# Patient Record
Sex: Female | Born: 1935 | Race: Black or African American | Hispanic: No | Marital: Single | Smoking: Never smoker
Health system: Southern US, Community
[De-identification: ages and names within clinical notes are randomized; demographics above are authoritative.]

## PROBLEM LIST (undated history)

## (undated) DIAGNOSIS — F0280 Dementia in other diseases classified elsewhere without behavioral disturbance: Secondary | ICD-10-CM

## (undated) DIAGNOSIS — G309 Alzheimer's disease, unspecified: Secondary | ICD-10-CM

## (undated) DIAGNOSIS — E876 Hypokalemia: Secondary | ICD-10-CM

## (undated) DIAGNOSIS — F209 Schizophrenia, unspecified: Secondary | ICD-10-CM

## (undated) DIAGNOSIS — B353 Tinea pedis: Secondary | ICD-10-CM

## (undated) DIAGNOSIS — R600 Localized edema: Secondary | ICD-10-CM

---

## 2013-03-28 LAB — COMPREHENSIVE METABOLIC PANEL
Albumin: 3.5 g/dL (ref 3.4–5.0)
Alkaline Phosphatase: 96 U/L (ref 50–136)
Anion Gap: 13 (ref 7–16)
Calcium, Total: 9 mg/dL (ref 8.5–10.1)
Co2: 25 mmol/L (ref 21–32)
Creatinine: 1.22 mg/dL (ref 0.60–1.30)
EGFR (African American): 50 — ABNORMAL LOW
EGFR (Non-African Amer.): 43 — ABNORMAL LOW
Glucose: 174 mg/dL — ABNORMAL HIGH (ref 65–99)
Osmolality: 287 (ref 275–301)
Sodium: 142 mmol/L (ref 136–145)
Total Protein: 7.6 g/dL (ref 6.4–8.2)

## 2013-03-28 LAB — DRUG SCREEN, URINE
Amphetamines, Ur Screen: NEGATIVE (ref ?–1000)
Benzodiazepine, Ur Scrn: NEGATIVE (ref ?–200)
Cannabinoid 50 Ng, Ur ~~LOC~~: NEGATIVE (ref ?–50)
MDMA (Ecstasy)Ur Screen: NEGATIVE (ref ?–500)
Methadone, Ur Screen: NEGATIVE (ref ?–300)
Opiate, Ur Screen: NEGATIVE (ref ?–300)
Tricyclic, Ur Screen: NEGATIVE (ref ?–1000)

## 2013-03-28 LAB — URINALYSIS, COMPLETE
Bilirubin,UR: NEGATIVE
Blood: NEGATIVE
Glucose,UR: NEGATIVE mg/dL (ref 0–75)
Ketone: NEGATIVE
Protein: NEGATIVE
RBC,UR: 1 /HPF (ref 0–5)
Specific Gravity: 1.012 (ref 1.003–1.030)

## 2013-03-28 LAB — CBC
MCH: 29.3 pg (ref 26.0–34.0)
MCHC: 34 g/dL (ref 32.0–36.0)
RBC: 4.78 10*6/uL (ref 3.80–5.20)
RDW: 15.1 % — ABNORMAL HIGH (ref 11.5–14.5)
WBC: 8.6 10*3/uL (ref 3.6–11.0)

## 2013-03-28 LAB — ETHANOL
Ethanol %: <0.003 % (ref 0.000–0.080)
Ethanol: <3 mg/dL

## 2013-03-28 LAB — TSH: Thyroid Stimulating Horm: 1.45 u[IU]/mL

## 2013-03-29 ENCOUNTER — Inpatient Hospital Stay: Payer: Self-pay | Admitting: Internal Medicine

## 2013-03-29 LAB — BASIC METABOLIC PANEL
Anion Gap: 10 (ref 7–16)
BUN: 8 mg/dL (ref 7–18)
Calcium, Total: 8.7 mg/dL (ref 8.5–10.1)
Co2: 30 mmol/L (ref 21–32)
EGFR (Non-African Amer.): >60
Osmolality: 290 (ref 275–301)
Potassium: 2.8 mmol/L — ABNORMAL LOW (ref 3.5–5.1)

## 2013-03-29 LAB — TROPONIN I: Troponin-I: <0.02 ng/mL

## 2013-03-29 LAB — MAGNESIUM: Magnesium: 1.8 mg/dL

## 2013-03-30 LAB — URINALYSIS, COMPLETE
Bilirubin,UR: NEGATIVE
Glucose,UR: NEGATIVE mg/dL (ref 0–75)
Nitrite: NEGATIVE
Ph: 7 (ref 4.5–8.0)
Protein: NEGATIVE
RBC,UR: 3 /HPF (ref 0–5)
Specific Gravity: 1.013 (ref 1.003–1.030)
WBC UR: 34 /HPF (ref 0–5)

## 2013-03-31 LAB — CBC WITH DIFFERENTIAL/PLATELET
Basophil %: 0.5 %
Eosinophil %: 1.9 %
HCT: 34.7 % — ABNORMAL LOW (ref 35.0–47.0)
Lymphocyte #: 1.9 10*3/uL (ref 1.0–3.6)
Lymphocyte %: 26.8 %
MCH: 29.4 pg (ref 26.0–34.0)
MCHC: 34 g/dL (ref 32.0–36.0)
MCV: 87 fL (ref 80–100)
Monocyte #: 0.5 x10 3/mm (ref 0.2–0.9)
RBC: 4.01 10*6/uL (ref 3.80–5.20)
RDW: 15.4 % — ABNORMAL HIGH (ref 11.5–14.5)
WBC: 7 10*3/uL (ref 3.6–11.0)

## 2013-03-31 LAB — URINE CULTURE

## 2013-03-31 LAB — BASIC METABOLIC PANEL
BUN: 5 mg/dL — ABNORMAL LOW (ref 7–18)
Calcium, Total: 8.4 mg/dL — ABNORMAL LOW (ref 8.5–10.1)
Chloride: 105 mmol/L (ref 98–107)
Co2: 29 mmol/L (ref 21–32)
Glucose: 97 mg/dL (ref 65–99)
Osmolality: 278 (ref 275–301)
Potassium: 2.8 mmol/L — ABNORMAL LOW (ref 3.5–5.1)
Sodium: 141 mmol/L (ref 136–145)

## 2013-04-04 LAB — BASIC METABOLIC PANEL
Anion Gap: 8 (ref 7–16)
Calcium, Total: 9.1 mg/dL (ref 8.5–10.1)
Chloride: 104 mmol/L (ref 98–107)
Co2: 30 mmol/L (ref 21–32)
Creatinine: 0.97 mg/dL (ref 0.60–1.30)
EGFR (African American): >60
Glucose: 112 mg/dL — ABNORMAL HIGH (ref 65–99)
Osmolality: 286 (ref 275–301)
Potassium: 2.8 mmol/L — ABNORMAL LOW (ref 3.5–5.1)

## 2013-04-04 LAB — POTASSIUM: Potassium: 3.3 mmol/L — ABNORMAL LOW (ref 3.5–5.1)

## 2013-04-04 LAB — MAGNESIUM: Magnesium: 2.1 mg/dL

## 2013-06-16 ENCOUNTER — Inpatient Hospital Stay (HOSPITAL_COMMUNITY)
Admission: EM | Admit: 2013-06-16 | Discharge: 2013-06-22 | DRG: 629 | Disposition: A | Discharge status: Skilled Nursing Facility | Payer: Medicare Other | Attending: Internal Medicine | Admitting: Internal Medicine

## 2013-06-16 ENCOUNTER — Encounter (HOSPITAL_COMMUNITY): Payer: Self-pay | Admitting: Emergency Medicine

## 2013-06-16 ENCOUNTER — Emergency Department (HOSPITAL_COMMUNITY): Payer: Medicare Other

## 2013-06-16 DIAGNOSIS — Z79899 Other long term (current) drug therapy: Secondary | ICD-10-CM

## 2013-06-16 DIAGNOSIS — Z6841 Body Mass Index (BMI) 40.0 and over, adult: Secondary | ICD-10-CM

## 2013-06-16 DIAGNOSIS — F209 Schizophrenia, unspecified: Secondary | ICD-10-CM | POA: Diagnosis present

## 2013-06-16 DIAGNOSIS — L97509 Non-pressure chronic ulcer of other part of unspecified foot with unspecified severity: Secondary | ICD-10-CM | POA: Diagnosis present

## 2013-06-16 DIAGNOSIS — M869 Osteomyelitis, unspecified: Secondary | ICD-10-CM | POA: Diagnosis present

## 2013-06-16 DIAGNOSIS — F0390 Unspecified dementia without behavioral disturbance: Secondary | ICD-10-CM

## 2013-06-16 DIAGNOSIS — E1169 Type 2 diabetes mellitus with other specified complication: Principal | ICD-10-CM | POA: Diagnosis present

## 2013-06-16 DIAGNOSIS — F0280 Dementia in other diseases classified elsewhere without behavioral disturbance: Secondary | ICD-10-CM | POA: Diagnosis present

## 2013-06-16 DIAGNOSIS — G309 Alzheimer's disease, unspecified: Secondary | ICD-10-CM | POA: Diagnosis present

## 2013-06-16 DIAGNOSIS — M9080 Osteopathy in diseases classified elsewhere, unspecified site: Secondary | ICD-10-CM | POA: Diagnosis present

## 2013-06-16 DIAGNOSIS — L84 Corns and callosities: Secondary | ICD-10-CM | POA: Diagnosis present

## 2013-06-16 DIAGNOSIS — M2010 Hallux valgus (acquired), unspecified foot: Secondary | ICD-10-CM | POA: Diagnosis present

## 2013-06-16 DIAGNOSIS — M86179 Other acute osteomyelitis, unspecified ankle and foot: Secondary | ICD-10-CM | POA: Diagnosis present

## 2013-06-16 HISTORY — DX: Alzheimer's disease, unspecified: G30.9

## 2013-06-16 HISTORY — DX: Dementia in other diseases classified elsewhere, unspecified severity, without behavioral disturbance, psychotic disturbance, mood disturbance, and anxiety: F02.80

## 2013-06-16 HISTORY — DX: Schizophrenia, unspecified: F20.9

## 2013-06-16 HISTORY — DX: Tinea pedis: B35.3

## 2013-06-16 HISTORY — DX: Localized edema: R60.0

## 2013-06-16 HISTORY — DX: Hypokalemia: E87.6

## 2013-06-16 LAB — CBC WITH DIFFERENTIAL/PLATELET
Basophils Absolute: 0 10*3/uL (ref 0.0–0.1)
Basophils Relative: 0 % (ref 0–1)
Hemoglobin: 12 g/dL (ref 12.0–15.0)
Lymphocytes Relative: 28 % (ref 12–46)
MCHC: 32 g/dL (ref 30.0–36.0)
Monocytes Relative: 7 % (ref 3–12)
Neutro Abs: 4.7 10*3/uL (ref 1.7–7.7)
Neutrophils Relative %: 62 % (ref 43–77)
RBC: 4.13 MIL/uL (ref 3.87–5.11)
WBC: 7.6 10*3/uL (ref 4.0–10.5)

## 2013-06-16 LAB — BASIC METABOLIC PANEL
BUN: 15 mg/dL (ref 6–23)
CO2: 29 mEq/L (ref 19–32)
Chloride: 102 mEq/L (ref 96–112)
Creatinine, Ser: 0.83 mg/dL (ref 0.50–1.10)
GFR calc Af Amer: 77 mL/min — ABNORMAL LOW (ref >90)
Glucose, Bld: 131 mg/dL — ABNORMAL HIGH (ref 70–99)
Potassium: 4.6 mEq/L (ref 3.7–5.3)

## 2013-06-16 MED ORDER — DIVALPROEX SODIUM ER 500 MG PO TB24
500.0000 mg | ORAL_TABLET | Freq: Every day | ORAL | Status: DC
  Administered 2013-06-17 – 2013-06-21 (×5): 500 mg via ORAL
  Filled 2013-06-16 (×7): qty 1

## 2013-06-16 MED ORDER — ACETAMINOPHEN 325 MG PO TABS: 650.0000 mg | ORAL_TABLET | Freq: Four times a day (QID) | ORAL | Status: DC | PRN

## 2013-06-16 MED ORDER — INSULIN ASPART 100 UNIT/ML ~~LOC~~ SOLN
0.0000 [IU] | Freq: Three times a day (TID) | SUBCUTANEOUS | Status: DC
  Administered 2013-06-18: 2 [IU] via SUBCUTANEOUS
  Administered 2013-06-19: 1 [IU] via SUBCUTANEOUS
  Administered 2013-06-20: 2 [IU] via SUBCUTANEOUS
  Administered 2013-06-21: 1 [IU] via SUBCUTANEOUS

## 2013-06-16 MED ORDER — GUAIFENESIN 100 MG/5ML PO SOLN: 10.0000 mL | Freq: Four times a day (QID) | ORAL | Status: DC | PRN

## 2013-06-16 MED ORDER — PSEUDOEPHEDRINE HCL ER 120 MG PO TB12
120.0000 mg | ORAL_TABLET | Freq: Two times a day (BID) | ORAL | Status: DC
  Administered 2013-06-17 – 2013-06-22 (×10): 120 mg via ORAL
  Filled 2013-06-16 (×12): qty 1

## 2013-06-16 MED ORDER — VANCOMYCIN HCL IN DEXTROSE 1-5 GM/200ML-% IV SOLN
1000.0000 mg | Freq: Once | INTRAVENOUS | Status: AC
  Administered 2013-06-16: 1000 mg via INTRAVENOUS
  Filled 2013-06-16: qty 200

## 2013-06-16 MED ORDER — ACETAMINOPHEN 650 MG RE SUPP: 650.0000 mg | Freq: Four times a day (QID) | RECTAL | Status: DC | PRN

## 2013-06-16 MED ORDER — LORATADINE-PSEUDOEPHEDRINE ER 10-240 MG PO TB24: 1.0000 | ORAL_TABLET | Freq: Every day | ORAL | Status: DC

## 2013-06-16 MED ORDER — HYDROCODONE-ACETAMINOPHEN 5-325 MG PO TABS: 1.0000 | ORAL_TABLET | ORAL | Status: DC | PRN

## 2013-06-16 MED ORDER — SODIUM CHLORIDE 0.9 % IJ SOLN
3.0000 mL | Freq: Two times a day (BID) | INTRAMUSCULAR | Status: DC
  Administered 2013-06-18: 3 mL via INTRAVENOUS

## 2013-06-16 MED ORDER — ONDANSETRON HCL 4 MG PO TABS: 4.0000 mg | ORAL_TABLET | Freq: Four times a day (QID) | ORAL | Status: DC | PRN

## 2013-06-16 MED ORDER — POTASSIUM CHLORIDE ER 10 MEQ PO TBCR
10.0000 meq | EXTENDED_RELEASE_TABLET | Freq: Every day | ORAL | Status: DC
  Administered 2013-06-17 – 2013-06-22 (×6): 10 meq via ORAL
  Filled 2013-06-16 (×6): qty 1

## 2013-06-16 MED ORDER — LORATADINE 10 MG PO TABS
10.0000 mg | ORAL_TABLET | Freq: Every day | ORAL | Status: DC
  Administered 2013-06-17 – 2013-06-22 (×6): 10 mg via ORAL
  Filled 2013-06-16 (×7): qty 1

## 2013-06-16 MED ORDER — VENLAFAXINE HCL 37.5 MG PO TABS
37.5000 mg | ORAL_TABLET | Freq: Two times a day (BID) | ORAL | Status: DC
  Administered 2013-06-17 – 2013-06-22 (×11): 37.5 mg via ORAL
  Filled 2013-06-16 (×13): qty 1

## 2013-06-16 MED ORDER — ZOLPIDEM TARTRATE 5 MG PO TABS
5.0000 mg | ORAL_TABLET | Freq: Every evening | ORAL | Status: DC | PRN
  Administered 2013-06-20: 5 mg via ORAL
  Filled 2013-06-16: qty 1

## 2013-06-16 MED ORDER — PIPERACILLIN-TAZOBACTAM 3.375 G IVPB
3.3750 g | Freq: Three times a day (TID) | INTRAVENOUS | Status: DC
  Administered 2013-06-17 – 2013-06-18 (×4): 3.375 g via INTRAVENOUS
  Filled 2013-06-16 (×5): qty 50

## 2013-06-16 MED ORDER — SODIUM CHLORIDE 0.9 % IJ SOLN: 3.0000 mL | INTRAMUSCULAR | Status: DC | PRN

## 2013-06-16 MED ORDER — ONDANSETRON HCL 4 MG/2ML IJ SOLN: 4.0000 mg | Freq: Four times a day (QID) | INTRAMUSCULAR | Status: DC | PRN

## 2013-06-16 MED ORDER — ENOXAPARIN SODIUM 40 MG/0.4ML ~~LOC~~ SOLN
40.0000 mg | Freq: Every day | SUBCUTANEOUS | Status: DC
  Administered 2013-06-17 – 2013-06-21 (×6): 40 mg via SUBCUTANEOUS
  Filled 2013-06-16 (×7): qty 0.4

## 2013-06-16 MED ORDER — VANCOMYCIN HCL IN DEXTROSE 750-5 MG/150ML-% IV SOLN
750.0000 mg | Freq: Two times a day (BID) | INTRAVENOUS | Status: AC
Start: 2013-06-16 — End: 2013-06-18
  Administered 2013-06-17 – 2013-06-18 (×4): 750 mg via INTRAVENOUS
  Filled 2013-06-16 (×5): qty 150

## 2013-06-16 MED ORDER — SODIUM CHLORIDE 0.9 % IV SOLN: 250.0000 mL | INTRAVENOUS | Status: DC | PRN

## 2013-06-16 MED ORDER — PIPERACILLIN-TAZOBACTAM 3.375 G IVPB 30 MIN
3.3750 g | Freq: Once | INTRAVENOUS | Status: AC
  Administered 2013-06-16: 3.375 g via INTRAVENOUS
  Filled 2013-06-16: qty 50

## 2013-06-16 MED ORDER — ALUM & MAG HYDROXIDE-SIMETH 200-200-20 MG/5ML PO SUSP: 30.0000 mL | Freq: Four times a day (QID) | ORAL | Status: DC | PRN

## 2013-06-16 MED ORDER — SODIUM CHLORIDE 0.9 % IV SOLN
Freq: Once | INTRAVENOUS | Status: AC
  Administered 2013-06-16: 18:00:00 via INTRAVENOUS

## 2013-06-16 MED ORDER — MORPHINE SULFATE 2 MG/ML IJ SOLN: 1.0000 mg | INTRAMUSCULAR | Status: DC | PRN

## 2013-06-16 MED ORDER — ZIPRASIDONE HCL 80 MG PO CAPS
80.0000 mg | ORAL_CAPSULE | Freq: Two times a day (BID) | ORAL | Status: DC
  Administered 2013-06-17 – 2013-06-22 (×11): 80 mg via ORAL
  Filled 2013-06-16 (×13): qty 1

## 2013-06-16 NOTE — ED Notes (Signed)
Pt from [facility], memory care.  Pt is pleasantly confused c/o rt foot drainage.  Odorous discharge.  PA came in today and wanted her evaluated.

## 2013-06-16 NOTE — Progress Notes (Signed)
Utilization Review completed.  Willim Turnage RN CM  

## 2013-06-16 NOTE — ED Provider Notes (Signed)
CSN: [id number]     Arrival date & time 06/16/13  1536 History   First MD Initiated Contact with Patient 06/16/13 1547     Chief Complaint  Patient presents with  . Wound Infection   (Consider location/radiation/quality/duration/timing/severity/associated sxs/prior Treatment) HPI Comments: Patient presents with right foot pain and drainage. She states she noticed a sore about 2 weeks ago and that's progressively gotten worse. She currently lives in [facility] in the memory care unit.  She denies any fevers or chills. She denies any history of diabetes.  She has not been taking any medications for this infection.  The PA at the facility saw it today and sent her over for here for evaluation.   Past Medical History  Diagnosis Date  . Alzheimer's dementia   . Schizophrenia   . Tinea pedis   . Bilateral lower extremity edema   . Hypokalemia    History reviewed. No pertinent past surgical history. History reviewed. No pertinent family history. History  Substance Use Topics  . Smoking status: Never Smoker   . Smokeless tobacco: Not on file  . Alcohol Use: No   OB History   Grav Para Term Preterm Abortions TAB SAB Ect Mult Living                 Review of Systems  Constitutional: Negative for fever, chills, diaphoresis and fatigue.  HENT: Negative for congestion, rhinorrhea and sneezing.   Eyes: Negative.   Respiratory: Negative for cough, chest tightness and shortness of breath.   Cardiovascular: Negative for chest pain and leg swelling.  Gastrointestinal: Negative for nausea, vomiting, abdominal pain, diarrhea and blood in stool.  Genitourinary: Negative for frequency, hematuria, flank pain and difficulty urinating.  Musculoskeletal: Positive for arthralgias. Negative for back pain.  Skin: Positive for wound. Negative for rash.  Neurological: Negative for dizziness, speech difficulty, weakness, numbness and headaches.    Allergies  Review of patient's allergies  indicates no known allergies.  Home Medications   Current Outpatient Rx  Name  Route  Sig  Dispense  Refill  . acetaminophen (TYLENOL) 500 MG tablet   Oral   Take 500 mg by mouth every 4 (four) hours as needed (fever/pain).         Marland Kitchen alum & mag hydroxide-simeth (MAALOX/MYLANTA) 200-200-20 MG/5ML suspension   Oral   Take 30 mLs by mouth every 6 (six) hours as needed for indigestion or heartburn.         . divalproex (DEPAKOTE ER) 500 MG 24 hr tablet   Oral   Take 500 mg by mouth at bedtime.         Marland Kitchen guaiFENesin (ROBITUSSIN) 100 MG/5ML SOLN   Oral   Take 10 mLs by mouth every 6 (six) hours as needed for cough or to loosen phlegm (not to exceed 4 doses).         Marland Kitchen loperamide (IMODIUM A-D) 2 MG tablet   Oral   Take 2 mg by mouth 4 (four) times daily as needed for diarrhea or loose stools.         Marland Kitchen loratadine-pseudoephedrine (CLARITIN-D 24-HOUR) 10-240 MG per 24 hr tablet   Oral   Take 1 tablet by mouth daily.         . magnesium hydroxide (MILK OF MAGNESIA) 400 MG/5ML suspension   Oral   Take 30 mLs by mouth at bedtime as needed for mild constipation.         . potassium chloride (KLOR-CON 10) 10  MEQ tablet   Oral   Take 10 mEq by mouth daily.         Marland Kitchen venlafaxine (EFFEXOR) 37.5 MG tablet   Oral   Take 37.5 mg by mouth 2 (two) times daily.         . ziprasidone (GEODON) 80 MG capsule   Oral   Take 80 mg by mouth 2 (two) times daily with a meal.          BP 116/69  Pulse 78  Temp(Src) 97.7 F (36.5 C) (Oral)  Resp 16  SpO2 98% Physical Exam  Constitutional: She is oriented to person, place, and time. She appears well-developed and well-nourished.  HENT:  Head: Normocephalic and atraumatic.  Eyes: Pupils are equal, round, and reactive to light.  Neck: Normal range of motion. Neck supple.  Cardiovascular: Normal rate, regular rhythm and normal heart sounds.   Pulmonary/Chest: Effort normal and breath sounds normal. No respiratory distress.  She has no wheezes. She has no rales. She exhibits no tenderness.  Abdominal: Soft. Bowel sounds are normal. There is no tenderness. There is no rebound and no guarding.  Musculoskeletal: Normal range of motion. She exhibits edema.  Patient has a 1.5 cm open wound to the plantar surface of the right first toe at the MTP joint. There is minimal drainage from the area. The skin is scaly around it and there some blackened tissue to the dorsum of the foot around the toe and in the web space between the first and second digits. There some warmth and erythema to the foot and some warmth to the lower leg. Pedal pulses are intact.  Lymphadenopathy:    She has no cervical adenopathy.  Neurological: She is alert and oriented to person, place, and time.  Skin: Skin is warm and dry. No rash noted.  Psychiatric: She has a normal mood and affect.    ED Course  Procedures (including critical care time) Labs Review Labs Reviewed  BASIC METABOLIC PANEL - Abnormal; Notable for the following:    Glucose, Bld 131 (*)    GFR calc non Af Amer 67 (*)    GFR calc Af Amer 77 (*)    All other components within normal limits  CBC WITH DIFFERENTIAL   Imaging Review Dg Foot Complete Right  06/16/2013   CLINICAL DATA:  Nonhealing wound.  EXAM: RIGHT FOOT COMPLETE - 3+ VIEW  COMPARISON:  None.  FINDINGS: Severe hallux valgus deformity with associated degenerative changes at the 1st metatarsal phalangeal joint. There is a wound containing air on the plantar aspect of the foot overlying the 1st metatarsal phalangeal joint. They lucency in the 1st metatarsal head could suggest subtle changes of osteomyelitis. No gross destructive bony changes. The remaining bony structures are intact. A moderate size calcaneal heel spur is noted. Moderate pes planus.  IMPRESSION: Open wound containing air on the plantar aspect of the forefoot overlying the 1st metatarsal phalangeal joint.  Mild lucency in the 1st metatarsal head. Could not  exclude early changes of osteomyelitis.   Electronically Signed   By: Loralie Champagne M.D.   On: 06/16/2013 16:46    EKG Interpretation   None       MDM   1. Osteomyelitis    Patient presents with an open wound to her right foot. There some findings concerning for a dry gangrene. There's also some probable osteomyelitis on x-ray. She was started on IV antibiotics including vancomycin and Zosyn. I will consult hospitalist for admission.  Rolan Bucco, MD 06/16/13 [phone]

## 2013-06-16 NOTE — Progress Notes (Signed)
   CARE MANAGEMENT ED NOTE 06/16/2013  Patient:  [name], [name]   Account Number:  [id number]  Date Initiated:  06/16/2013  Documentation initiated by:  Radford Pax  Subjective/Objective Assessment:   Patient presents to ED with right foot pain and drainage.     Subjective/Objective Assessment Detail:   Xray performed on right foot concerning for osteomylitis.     Action/Plan:   IV fluids and IV antibiotics given to patient in ED.   Action/Plan Detail:   Anticipated DC Date:       Status Recommendation to Physician:   Result of Recommendation:    Other ED Services  Consult Working Plan    DC Planning Services  Other    Choice offered to / List presented to:            Status of service:    ED Comments:   ED Comments Detail:  As per patient's chart, patient is resident at [facility].  Her pcp at this facility is Dr. Ron Parker. System updated.

## 2013-06-16 NOTE — Progress Notes (Signed)
ANTIBIOTIC CONSULT NOTE - INITIAL  Pharmacy Consult for Vancomycin and Zosyn Indication: Osetomyelitis right foot  No Known Allergies  Patient Measurements: Height: 4\' 9"  (144.8 cm) Weight: 195 lb 12.3 oz (88.8 kg) IBW/kg (Calculated) : 38.6   Vital Signs: Temp: 98.6 F (37 C) (12/31 2231) Temp src: Oral (12/31 2231) BP: 121/65 mmHg (12/31 2231) Pulse Rate: 87 (12/31 2231)    Intake/Output from this shift: Total I/O In: -  Out: 600 [Urine:600]  Labs:  Recent Labs  06/16/13 1700  WBC 7.6  HGB 12.0  PLT 278  CREATININE 0.83   Estimated Creatinine Clearance: 53.4 ml/min (by C-G formula based on Cr of 0.83). No results found for this basename: VANCOTROUGH, VANCOPEAK, VANCORANDOM, GENTTROUGH, GENTPEAK, GENTRANDOM, TOBRATROUGH, TOBRAPEAK, TOBRARND, AMIKACINPEAK, AMIKACINTROU, AMIKACIN,  in the last 72 hours   Microbiology: No results found for this or any previous visit (from the past 720 hour(s)).  Medical History: Past Medical History  Diagnosis Date  . Alzheimer's dementia   . Schizophrenia   . Tinea pedis   . Bilateral lower extremity edema   . Hypokalemia     Medications:  Scheduled:  . divalproex  500 mg Oral QHS  . enoxaparin (LOVENOX) injection  40 mg Subcutaneous QHS  . [START ON 06/17/2013] insulin aspart  0-9 Units Subcutaneous TID WC  . [START ON 06/17/2013] loratadine  10 mg Oral Daily   And  . [START ON 06/17/2013] pseudoephedrine  120 mg Oral BID  . [START ON 06/17/2013] piperacillin-tazobactam (ZOSYN)  IV  3.375 g Intravenous Q8H  . [START ON 06/17/2013] potassium chloride  10 mEq Oral Daily  . sodium chloride  3 mL Intravenous Q12H  . vancomycin  750 mg Intravenous Q12H  . venlafaxine  37.5 mg Oral BID  . [START ON 06/17/2013] ziprasidone  80 mg Oral BID WC   Infusions:    Assessment: [name] presents from [facility] memory care unit with open wound on right foot concerning for dry gangrene and probable osteomyelitis on x-ray. She received one-time doses of  vancomycin 1g and Zosyn 3.375g in the ED.  Afebrile WBC 7.6 SCr 0.83  Goal of Therapy:  Vancomycin trough level 15-20 mcg/ml  Plan:  1. Vancomycin 1Gm x1 then 750mg  IV q12h 2. Zosyn 3.375g IV q8h (4 hour infusion time). 3. F/u SCr, trough levels for dose adjustments.  Susanne Greenhouse R 06/16/2013,11:37 PM

## 2013-06-16 NOTE — H&P (Signed)
[facility]                                                                                    Patient Demographics  [name], is a 78 y.o. female  CSN: [id number]  MRN: [id number]  DOB - June 16, 1935  Admit Date - 06/16/2013  Outpatient Primary MD for the patient is Ron Parker, MD   With History of -  Past Medical History  Diagnosis Date  . Alzheimer's dementia   . Schizophrenia   . Tinea pedis   . Bilateral lower extremity edema   . Hypokalemia       History reviewed. No pertinent past surgical history.  in for   Chief Complaint  Patient presents with  . Wound Infection     HPI  [name]  is a 78 y.o. female, sent from the nursing home for evaluation of a right foot wound with foul-smelling discharge. I do not have any information about that time this has started . The patient is not a very good historian and is pleasantly confused. Patient denies any chest pain shortness of breath denies any fever or chills. No history of trauma to right foot, patient denies any pain.    Review of Systems    In addition to the HPI above,  No Fever-chills, No Headache, No changes with Vision or hearing, No problems swallowing food or Liquids, No Chest pain, Cough or Shortness of Breath, No Abdominal pain, No Nausea or Vommitting, Bowel movements are regular, No Blood in stool or Urine, No dysuria, No new skin rashes or bruises, No new joints pains-aches,  No new weakness, tingling, numbness in any extremity, No recent weight gain or loss, No polyuria, polydypsia or polyphagia, No significant Mental Stressors.  A full 10 point Review of Systems was done, except as stated above, all other Review of Systems were negative.   Social History History  Substance Use Topics  . Smoking status: Never Smoker   . Smokeless tobacco: Not on file  . Alcohol Use: No     Family History Could not be obtained due to patient's condition.  Prior to  Admission medications   Medication Sig Start Date End Date Taking? Authorizing Provider  acetaminophen (TYLENOL) 500 MG tablet Take 500 mg by mouth every 4 (four) hours as needed (fever/pain).   Yes Historical Provider, MD  alum & mag hydroxide-simeth (MAALOX/MYLANTA) 200-200-20 MG/5ML suspension Take 30 mLs by mouth every 6 (six) hours as needed for indigestion or heartburn.   Yes Historical Provider, MD  divalproex (DEPAKOTE ER) 500 MG 24 hr tablet Take 500 mg by mouth at bedtime.   Yes Historical Provider, MD  guaiFENesin (ROBITUSSIN) 100 MG/5ML SOLN Take 10 mLs by mouth every 6 (six) hours as needed for cough or to loosen phlegm (not to exceed 4 doses).   Yes Historical Provider, MD  loperamide (IMODIUM A-D) 2 MG tablet Take 2 mg by mouth 4 (four) times daily as needed for diarrhea or loose stools.   Yes Historical Provider, MD  loratadine-pseudoephedrine (CLARITIN-D 24-HOUR) 10-240 MG per 24 hr tablet Take 1 tablet by mouth daily.   Yes Historical Provider, MD  magnesium  hydroxide (MILK OF MAGNESIA) 400 MG/5ML suspension Take 30 mLs by mouth at bedtime as needed for mild constipation.   Yes Historical Provider, MD  potassium chloride (KLOR-CON 10) 10 MEQ tablet Take 10 mEq by mouth daily.   Yes Historical Provider, MD  venlafaxine (EFFEXOR) 37.5 MG tablet Take 37.5 mg by mouth 2 (two) times daily.   Yes Historical Provider, MD  ziprasidone (GEODON) 80 MG capsule Take 80 mg by mouth 2 (two) times daily with a meal.   Yes Historical Provider, MD    Allergies no known allergies  Physical Exam  Vitals  Blood pressure 128/67, pulse 85, temperature 98.5 F (36.9 C), temperature source Oral, resp. rate 16, SpO2 100.00%.   1. General elderly African American female in no acute distress  2.  Not Suicidal or Homicidal, confused.  3. No F.N deficits, ALL C.Nerves Intact, Strength 5/5 all 4 extremities, Sensation intact all 4 extremities, Plantars down going.  4. Ears and Eyes appear Normal,  Conjunctivae clear, PERRLA. Moist Oral Mucosa.  5. Supple Neck, No JVD, No cervical lymphadenopathy appriciated, No Carotid Bruits.  6. Symmetrical Chest wall movement, Good air movement bilaterally, CTAB.  7. RRR, No Gallops, Rubs or Murmurs, No Parasternal Heave.  8. Positive Bowel Sounds, Abdomen Soft, Non tender, No organomegaly appriciated,No rebound -guarding or rigidity.  9.  No Cyanosis, Normal Skin Turgor, No Skin Rash or Bruise.  10. Good muscle tone,  joints appear normal , no effusions, Normal ROM.  11. No Palpable Lymph Nodes in Neck or Axillae  12. Right foot ulcer, first tarsal metatarsal bone open and draining, brownish discharge around one by one centimeters round  Data Review  CBC  Recent Labs Lab 06/16/13 1700  WBC 7.6  HGB 12.0  HCT 37.5  PLT 278  MCV 90.8  MCH 29.1  MCHC 32.0  RDW 14.3  LYMPHSABS 2.2  MONOABS 0.5  EOSABS 0.2  BASOSABS 0.0   ------------------------------------------------------------------------------------------------------------------  Chemistries   Recent Labs Lab 06/16/13 1700  NA 140  K 4.6  CL 102  CO2 29  GLUCOSE 131*  BUN 15  CREATININE 0.83  CALCIUM 9.3   ------------------------------------------------------------------------------------------------------------------  Dg Foot Complete Right  06/16/2013   CLINICAL DATA:  Nonhealing wound.  EXAM: RIGHT FOOT COMPLETE - 3+ VIEW  COMPARISON:  None.  FINDINGS: Severe hallux valgus deformity with associated degenerative changes at the 1st metatarsal phalangeal joint. There is a wound containing air on the plantar aspect of the foot overlying the 1st metatarsal phalangeal joint. They lucency in the 1st metatarsal head could suggest subtle changes of osteomyelitis. No gross destructive bony changes. The remaining bony structures are intact. A moderate size calcaneal heel spur is noted. Moderate pes planus.  IMPRESSION: Open wound containing air on the plantar aspect of  the forefoot overlying the 1st metatarsal phalangeal joint.  Mild lucency in the 1st metatarsal head. Could not exclude early changes of osteomyelitis.   Electronically Signed   By: Loralie Champagne M.D.   On: 06/16/2013 16:46      Assessment & Plan  1.  foot osteomyelitiswith open ulcer on the tarsal metatarsal joint. Patient is nonseptic however received IV antibiotics o  in the emergency room. Pulses were felt but feeble. Right foot is warm. Check right leg Doppler 2. Mild hyperglycemia, no history of diabetes mellitus, start insulin sliding scale 3. Dementia; continue with medications 4. Schizophrenia; continue with medications  Plan  Continue with IV antibiotics started in the emergency room Culture right foot  ulcer Consult orthopedics in a.m. Elevate right foot    DVT Prophylaxis Lovenox  AM Labs Ordered, also please review Full Orders    Code Status Full  Disposition Plan:Back to Assisted living facility  Time spent in minutes : 36 minutes  Condition fair

## 2013-06-17 DIAGNOSIS — L97509 Non-pressure chronic ulcer of other part of unspecified foot with unspecified severity: Secondary | ICD-10-CM

## 2013-06-17 DIAGNOSIS — M86179 Other acute osteomyelitis, unspecified ankle and foot: Secondary | ICD-10-CM

## 2013-06-17 LAB — GLUCOSE, CAPILLARY
GLUCOSE-CAPILLARY: 92 mg/dL (ref 70–99)
Glucose-Capillary: 90 mg/dL (ref 70–99)
Glucose-Capillary: 100 mg/dL — ABNORMAL HIGH (ref 70–99)
Glucose-Capillary: 120 mg/dL — ABNORMAL HIGH (ref 70–99)

## 2013-06-17 LAB — HEMOGLOBIN A1C
Hgb A1c MFr Bld: 6.3 % — ABNORMAL HIGH (ref <5.7)
Mean Plasma Glucose: 134 mg/dL — ABNORMAL HIGH (ref <117)

## 2013-06-17 NOTE — Progress Notes (Signed)
VASCULAR LAB PRELIMINARY  ARTERIAL  ABI completed:Right ABI indicates normal arterial flow, however, waveforms are abnormal.  Left ABI indicates moderate reduction in arterial flow, however, waveforms are abnormal.  Pressures may be falsely elevated secondary to calcified vessels.    RIGHT    LEFT    PRESSURE WAVEFORM  PRESSURE WAVEFORM  BRACHIAL 130 T BRACHIAL    DP   DP    AT 122 DM AT 94 DM  PT 111 DM PT 45 DM  PER   PER    GREAT TOE  NA GREAT TOE  NA    RIGHT LEFT  ABI 0.94 0.72     Jamyson Jirak, RVT 06/17/2013, 11:53 AM

## 2013-06-17 NOTE — Progress Notes (Signed)
[facility] PROGRESS NOTE  Tharon Kitch ZOX:[id number] DOB: August 18, 1934 DOA: Jul 03, 2013 PCP: Ron Parker, MD  Brief narrative: 79 y.o. Female who presented to [facility] ED 07/03/2013 from SNF with right foot ulcer with drainage and concern for possible infection. She was subsequently found to have possible osteomyelitis based on x ray findings.  Assessment/Plan:  Principal Problem:   Osteomyelitis, right foot - continue vanco and zosyn - appreciate orthopedic surgery consult and recommendations.   Active Problems:   Dementia - stable    Schizophrenia - on divalproex   Code Status: full code Family Communication: no family at the bedside  Disposition Plan: to SNF once stable  Manson Passey, MD  [facility] Pager [phone]  If 7PM-7AM, please contact night-coverage [email or web address] Password [email or web address] 06/17/2013, 10:51 AM   LOS: 1 day   Consultants:  Orthopedic surgery  Procedures:  I&D 06/17/2013  Antibiotics:  vanco July 03, 2013 -->  Zosyn 07-03-2013 -->  HPI/Subjective: No overnight events.   Objective: Filed Vitals:   03-Jul-2013 1545 Jul 03, 2013 1848 03-Jul-2013 2231 06/17/13 0647  BP: 116/69 128/67 121/65 138/71  Pulse: 78 85 87 81  Temp: 97.7 F (36.5 C) 98.5 F (36.9 C) 98.6 F (37 C) 98.3 F (36.8 C)  TempSrc: Oral Oral Oral Oral  Resp: 16 16 16 16   Height:   4\' 9"  (1.448 m)   Weight:   88.8 kg (195 lb 12.3 oz)   SpO2: 98% 100% 100% 99%    Intake/Output Summary (Last 24 hours) at 06/17/13 1051 Last data filed at 06/17/13 0644  Gross per 24 hour  Intake    250 ml  Output   1500 ml  Net  -1250 ml    Exam:   General:  Pt is not in acute distress  Cardiovascular: Regular rate and rhythm, S1/S2 appreciated   Respiratory: Clear to auscultation bilaterally, no wheezing, no crackles, no rhonchi  Abdomen: Soft, non tender, non distended, bowel sounds present, no guarding  Extremities: right foot ulcer, first metatarsal  Neuro: Grossly  nonfocal  Data Reviewed: Basic Metabolic Panel:  Recent Labs Lab 07-03-2013 1700  NA 140  K 4.6  CL 102  CO2 29  GLUCOSE 131*  BUN 15  CREATININE 0.83  CALCIUM 9.3   Liver Function Tests: No results found for this basename: AST, ALT, ALKPHOS, BILITOT, PROT, ALBUMIN,  in the last 168 hours No results found for this basename: LIPASE, AMYLASE,  in the last 168 hours No results found for this basename: AMMONIA,  in the last 168 hours CBC:  Recent Labs Lab 07-03-13 1700  WBC 7.6  NEUTROABS 4.7  HGB 12.0  HCT 37.5  MCV 90.8  PLT 278   Cardiac Enzymes: No results found for this basename: CKTOTAL, CKMB, CKMBINDEX, TROPONINI,  in the last 168 hours BNP: No components found with this basename: POCBNP,  CBG:  Recent Labs Lab 06/17/13 0731  GLUCAP 90    No results found for this or any previous visit (from the past 240 hour(s)).   Studies: Dg Foot Complete Right  July 03, 2013   CLINICAL DATA:  Nonhealing wound.  EXAM: RIGHT FOOT COMPLETE - 3+ VIEW  COMPARISON:  None.  FINDINGS: Severe hallux valgus deformity with associated degenerative changes at the 1st metatarsal phalangeal joint. There is a wound containing air on the plantar aspect of the foot overlying the 1st metatarsal phalangeal joint. They lucency in the 1st metatarsal head could suggest subtle changes of osteomyelitis. No gross destructive bony changes. The remaining bony structures are intact. A  moderate size calcaneal heel spur is noted. Moderate pes planus.  IMPRESSION: Open wound containing air on the plantar aspect of the forefoot overlying the 1st metatarsal phalangeal joint.  Mild lucency in the 1st metatarsal head. Could not exclude early changes of osteomyelitis.   Electronically Signed   By: Loralie ChampagneMark  Gallerani M.D.   On: 06/16/2013 16:46    Scheduled Meds: . divalproex  500 mg Oral QHS  . enoxaparin (LOVENOX) injection  40 mg Subcutaneous QHS  . insulin aspart  0-9 Units Subcutaneous TID WC  . loratadine  10  mg Oral Daily   And  . pseudoephedrine  120 mg Oral BID  . piperacillin-tazobactam (ZOSYN)  IV  3.375 g Intravenous Q8H  . potassium chloride  10 mEq Oral Daily  . vancomycin  750 mg Intravenous Q12H  . venlafaxine  37.5 mg Oral BID  . ziprasidone  80 mg Oral BID WC

## 2013-06-17 NOTE — Consult Note (Signed)
Reason for Consult:  Right foot ulcer with oseto Referring Physician:   [facility]  [name] is an 79 y.o. female.  HPI:   79 yo female admitted last evening from a nursing facility.  Is noted to have an ucler near hear right foot 1st ray area.  X-rays with concern for underlying osteo.  Ortho is consulted.  Past Medical History  Diagnosis Date  . Alzheimer's dementia   . Schizophrenia   . Tinea pedis   . Bilateral lower extremity edema   . Hypokalemia     History reviewed. No pertinent past surgical history.  History reviewed. No pertinent family history.  Social History:  reports that she has never smoked. She does not have any smokeless tobacco history on file. She reports that she does not drink alcohol or use illicit drugs.  Allergies: No Known Allergies  Medications: I have reviewed the patient's current medications.  Results for orders placed during the hospital encounter of 06/16/13 (from the past 48 hour(s))  CBC WITH DIFFERENTIAL     Status: None   Collection Time    06/16/13  5:00 PM      Result Value Range   WBC 7.6  4.0 - 10.5 K/uL   RBC 4.13  3.87 - 5.11 MIL/uL   Hemoglobin 12.0  12.0 - 15.0 g/dL   HCT 37.5  36.0 - 46.0 %   MCV 90.8  78.0 - 100.0 fL   MCH 29.1  26.0 - 34.0 pg   MCHC 32.0  30.0 - 36.0 g/dL   RDW 14.3  11.5 - 15.5 %   Platelets 278  150 - 400 K/uL   Neutrophils Relative % 62  43 - 77 %   Neutro Abs 4.7  1.7 - 7.7 K/uL   Lymphocytes Relative 28  12 - 46 %   Lymphs Abs 2.2  0.7 - 4.0 K/uL   Monocytes Relative 7  3 - 12 %   Monocytes Absolute 0.5  0.1 - 1.0 K/uL   Eosinophils Relative 2  0 - 5 %   Eosinophils Absolute 0.2  0.0 - 0.7 K/uL   Basophils Relative 0  0 - 1 %   Basophils Absolute 0.0  0.0 - 0.1 K/uL  BASIC METABOLIC PANEL     Status: Abnormal   Collection Time    06/16/13  5:00 PM      Result Value Range   Sodium 140  137 - 147 mEq/L   Comment: Please note change in reference range.   Potassium 4.6  3.7 - 5.3  mEq/L   Comment: Please note change in reference range.   Chloride 102  96 - 112 mEq/L   CO2 29  19 - 32 mEq/L   Glucose, Bld 131 (*) 70 - 99 mg/dL   BUN 15  6 - 23 mg/dL   Creatinine, Ser 0.83  0.50 - 1.10 mg/dL   Calcium 9.3  8.4 - 10.5 mg/dL   GFR calc non Af Amer 67 (*) >90 mL/min   GFR calc Af Amer 77 (*) >90 mL/min   Comment: (NOTE)     The eGFR has been calculated using the CKD EPI equation.     This calculation has not been validated in all clinical situations.     eGFR's persistently <90 mL/min signify possible Chronic Kidney     Disease.  HEMOGLOBIN A1C     Status: Abnormal   Collection Time    06/16/13  5:00 PM  Result Value Range   Hemoglobin A1C 6.3 (*) <5.7 %   Comment: (NOTE)                                                                               According to the ADA Clinical Practice Recommendations for 2011, when     HbA1c is used as a screening test:      >=6.5%   Diagnostic of Diabetes Mellitus               (if abnormal result is confirmed)     5.7-6.4%   Increased risk of developing Diabetes Mellitus     References:Diagnosis and Classification of Diabetes Mellitus,Diabetes     WCBJ,6283,15(VVOHY 1):S62-S69 and Standards of Medical Care in             Diabetes - 2011,Diabetes WVPX,1062,69 (Suppl 1):S11-S61.   Mean Plasma Glucose 134 (*) <117 mg/dL   Comment: Performed at [facility], CAP[facility]     Status: None   Collection Time    06/17/13  7:31 AM      Result Value Range   Glucose-Capillary 90  70 - 99 mg/dL   Comment 1 Notify RN    GLUCOSE, CAPILLARY     Status: None   Collection Time    06/17/13 12:15 PM      Result Value Range   Glucose-Capillary 92  70 - 99 mg/dL   Comment 1 Documented in Chart     Comment 2 Notify RN      Dg Foot Complete Right  06/16/2013   CLINICAL DATA:  Nonhealing wound.  EXAM: RIGHT FOOT COMPLETE - 3+ VIEW  COMPARISON:  None.  FINDINGS: Severe hallux valgus deformity with associated  degenerative changes at the 1st metatarsal phalangeal joint. There is a wound containing air on the plantar aspect of the foot overlying the 1st metatarsal phalangeal joint. They lucency in the 1st metatarsal head could suggest subtle changes of osteomyelitis. No gross destructive bony changes. The remaining bony structures are intact. A moderate size calcaneal heel spur is noted. Moderate pes planus.  IMPRESSION: Open wound containing air on the plantar aspect of the forefoot overlying the 1st metatarsal phalangeal joint.  Mild lucency in the 1st metatarsal head. Could not exclude early changes of osteomyelitis.   Electronically Signed   By: Kalman Jewels M.D.   On: 06/16/2013 16:46    ROS Blood pressure 138/71, pulse 81, temperature 98.3 F (36.8 C), temperature source Oral, resp. rate 16, height $RemoveBe'4\' 9"'RbzSahqMd$  (1.448 m), weight 88.8 kg (195 lb 12.3 oz), SpO2 99.00%. 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 1)  I did use a #10 blade at the bedside and performed a debridement of callous skin, soft-tissue and local bone.  Found some serous fluid and was able to get to a good bleeding base.  She will need daily soaks in dial-soapy water at the bedside followed by wet-to-dry dressing changes.  She will alos need a post-op shoe and continued antibiotics.  [name] Y 06/17/2013, 2:07 PM

## 2013-06-18 LAB — BASIC METABOLIC PANEL
BUN: 16 mg/dL (ref 6–23)
CHLORIDE: 100 meq/L (ref 96–112)
CO2: 27 mEq/L (ref 19–32)
Calcium: 9 mg/dL (ref 8.4–10.5)
Creatinine, Ser: 0.9 mg/dL (ref 0.50–1.10)
GFR calc non Af Amer: 61 mL/min — ABNORMAL LOW (ref >90)
GFR, EST AFRICAN AMERICAN: 70 mL/min — AB (ref >90)
Glucose, Bld: 127 mg/dL — ABNORMAL HIGH (ref 70–99)
Potassium: 4.1 mEq/L (ref 3.7–5.3)
Sodium: 140 mEq/L (ref 137–147)

## 2013-06-18 LAB — GLUCOSE, CAPILLARY
Glucose-Capillary: 72 mg/dL (ref 70–99)
Glucose-Capillary: 157 mg/dL — ABNORMAL HIGH (ref 70–99)
Glucose-Capillary: 98 mg/dL (ref 70–99)

## 2013-06-18 LAB — VANCOMYCIN, TROUGH: Vancomycin Tr: 14.5 ug/mL (ref 10.0–20.0)

## 2013-06-18 MED ORDER — VANCOMYCIN HCL IN DEXTROSE 1-5 GM/200ML-% IV SOLN
1000.0000 mg | Freq: Two times a day (BID) | INTRAVENOUS | Status: AC
  Administered 2013-06-19 – 2013-06-21 (×6): 1000 mg via INTRAVENOUS
  Filled 2013-06-18 (×6): qty 200

## 2013-06-18 NOTE — Progress Notes (Signed)
Clinical Social Work Department BRIEF PSYCHOSOCIAL ASSESSMENT 06/18/2013  Patient:  [name], [name]     Account Number:  [id number]     Admit date:  06/16/2013  Clinical Social Worker:  Ulyess Blossom  Date/Time:  06/18/2013 10:30 AM  Referred by:  Physician  Date Referred:  06/18/2013 Referred for  ALF Placement   Other Referral:   Interview type:  Patient Other interview type:    PSYCHOSOCIAL DATA Living Status:  FACILITY Admitted from facility:  [facility] Level of care:  Assisted Living Primary support name:  Annamary Rummage Primary support relationship to patient:  S[place]LING Degree of support available:   adequate    CURRENT CONCERNS Current Concerns  Post-Acute Placement   Other Concerns:    SOCIAL WORK ASSESSMENT / PLAN CSW received referral that pt admitted from [facility].    CSW met with pt at bedside to discuss. Pt confirmed that she is a resident of [facility] and plans to return there upon discharge. Pt concerned about post op boot as she would like to have one for her other foot as well. CSW discussed with RN and RN and CSW assisted with getting another boot ordered and pt aware that this will be billed to her account.    Per MD, pt may be medically ready for discharge tomorrow.    CSW contacted [facility]. CSW discussed that pt may be medically ready for discharge during weekend and will require daily dressing changes. [facility] stated that they use [facility]. [facility] discussed that facility can accept pt back during the weekend and weekend CSW would need to contact facility and ask for supervisor in order to facilitate pt discharge needs.    CSW notified RNCM in order for RNCM to assist with [place].    CSW to continue to follow and assist with pt discharge planning needs when pt medically ready for discharge.   Assessment/plan status:  Psychosocial Support/Ongoing  Assessment of Needs Other assessment/ plan:   discharge planning   Information/referral to community resources:   Referral back to [facility] and [facility] for [facility] needs    PATIENT'S/FAMILY'S RESPONSE TO PLAN OF CARE: Pt alert and oriented to person and place. CSW offered to contact pt family, but pt stated that she would update pt sister. Pt agreeable to return to [facility] upon discharge.     Drake Leach, MSW, [place] Social Work [phone]

## 2013-06-18 NOTE — Progress Notes (Signed)
[facility] PROGRESS NOTE  Cory MunchStella Perkovich ZOX:[id number] DOB: 03-27-1935 DOA: 06/16/2013 PCP: Ron ParkerBOWEN,SAMUEL, MD  Brief narrative: 79 y.o. Female [facility]F with right foot ulcer with drainage and concern for possible infection. She was subsequently found to have possible osteomyelitis based on x ray findings.   Assessment/Plan:   Principal Problem:  Osteomyelitis, right foot  - continue vanco and discontinue zosyn - appreciate orthopedic surgery consult and recommendations.  - wet to dry dressing changes  Active Problems:  Dementia  - stable  Schizophrenia  - on divalproex   Code Status: full code  Family Communication: no family at the bedside  Disposition Plan: to SNF once stable     Consultants:  Orthopedic surgery Procedures:  I&D 06/17/2013 Antibiotics:  vanco 06/16/2013 -->  Zosyn 06/16/2013 -->06/18/2013   Manson PasseyEVINE, Jguadalupe Opiela, MD  [facility] Pager [phone]  If 7PM-7AM, please contact night-coverage [email or web address] Password [email or web address] 06/18/2013, 2:31 PM   LOS: 2 days    HPI/Subjective: No acute overnight events.   Objective: Filed Vitals:   06/17/13 0647 06/17/13 1335 06/17/13 2127 06/18/13 0458  BP: 138/71 132/68 119/78 124/84  Pulse: 81 82 77 78  Temp: 98.3 F (36.8 C) 98.1 F (36.7 C) 97.6 F (36.4 C) 97.9 F (36.6 C)  TempSrc: Oral Oral Oral Oral  Resp: 16 20 18 16   Height:      Weight:      SpO2: 99% 97% 99% 97%    Intake/Output Summary (Last 24 hours) at 06/18/13 1431 Last data filed at 06/18/13 1020  Gross per 24 hour  Intake    340 ml  Output   1500 ml  Net  -1160 ml    Exam:   General:  Pt is alert, follows commands appropriately, not in acute distress  Cardiovascular: Regular rate and rhythm, S1/S2 appreciated   Respiratory: Clear to auscultation bilaterally, no wheezing, no crackles, no rhonchi  Abdomen: Soft, non tender, non distended, bowel sounds present, no guarding  Extremities: right  foot in dressing in place, pulses DP and PT palpable bilaterally  Neuro: Grossly nonfocal  Data Reviewed: Basic Metabolic Panel:  Recent Labs Lab 06/16/13 1700  NA 140  K 4.6  CL 102  CO2 29  GLUCOSE 131*  BUN 15  CREATININE 0.83  CALCIUM 9.3   Liver Function Tests: No results found for this basename: AST, ALT, ALKPHOS, BILITOT, PROT, ALBUMIN,  in the last 168 hours No results found for this basename: LIPASE, AMYLASE,  in the last 168 hours No results found for this basename: AMMONIA,  in the last 168 hours CBC:  Recent Labs Lab 06/16/13 1700  WBC 7.6  NEUTROABS 4.7  HGB 12.0  HCT 37.5  MCV 90.8  PLT 278   Cardiac Enzymes: No results found for this basename: CKTOTAL, CKMB, CKMBINDEX, TROPONINI,  in the last 168 hours BNP: No components found with this basename: POCBNP,  CBG:  Recent Labs Lab 06/17/13 1215 06/17/13 1557 06/17/13 2126 06/18/13 0829 06/18/13 1202  GLUCAP 92 100* 120* 72 157*    No results found for this or any previous visit (from the past 240 hour(s)).   Studies: Dg Foot Complete Right 06/16/2013     IMPRESSION: Open wound containing air on the plantar aspect of the forefoot overlying the 1st metatarsal phalangeal joint.  Mild lucency in the 1st metatarsal head. Could not exclude early changes of osteomyelitis.    Scheduled Meds: . divalproex  500 mg Oral QHS  . enoxaparin (  LOVENOX)   40 mg Subcutaneous QHS  . insulin aspart  0-9 Units Subcutaneous TID WC  . potassium chloride  10 mEq Oral Daily  . vancomycin  750 mg Intravenous Q12H  . venlafaxine  37.5 mg Oral BID  . ziprasidone  80 mg Oral BID WC

## 2013-06-18 NOTE — Progress Notes (Signed)
Patient ID: [name], female   DOB: Sep 19, 1934, 79 y.o.   MRN: [id number] From an Ortho standpoint she can be discharge to her nursing center with wound care daily to her right foot.  [facility] in 1-2 weeks.

## 2013-06-18 NOTE — Progress Notes (Signed)
ANTIBIOTIC CONSULT NOTE - Follow up  Pharmacy Consult for Vancomycin and Zosyn Indication: Osetomyelitis right foot  No Known Allergies  Patient Measurements: Height: 4\' 9"  (144.8 cm) Weight: 195 lb 12.3 oz (88.8 kg) IBW/kg (Calculated) : 38.6   Vital Signs: Temp: 97.5 F (36.4 C) (01/02 1400) Temp src: Oral (01/02 1400) BP: 120/68 mmHg (01/02 1400) Pulse Rate: 80 (01/02 1400)  01/01 0701 - 01/02 0700 In: 870 [P.O.:720; IV Piggyback:150] Out: 1600 [Urine:1600] Intake/Output from this shift: Total I/O In: -  Out: 600 [Urine:600]  Labs:  Recent Labs  06/16/13 1700 06/18/13 1757  WBC 7.6  --   HGB 12.0  --   PLT 278  --   CREATININE 0.83 0.90   Estimated Creatinine Clearance: 49.3 ml/min (by C-G formula based on Cr of 0.9).  Recent Labs  06/18/13 1757  VANCOTROUGH 14.5     Microbiology: No results found for this or any previous visit (from the past 720 hour(s)).  Medical History: Past Medical History  Diagnosis Date  . Alzheimer's dementia   . Schizophrenia   . Tinea pedis   . Bilateral lower extremity edema   . Hypokalemia     Medications:  Scheduled:  . divalproex  500 mg Oral QHS  . enoxaparin (LOVENOX) injection  40 mg Subcutaneous QHS  . insulin aspart  0-9 Units Subcutaneous TID WC  . loratadine  10 mg Oral Daily   And  . pseudoephedrine  120 mg Oral BID  . potassium chloride  10 mEq Oral Daily  . sodium chloride  3 mL Intravenous Q12H  . vancomycin  750 mg Intravenous Q12H  . venlafaxine  37.5 mg Oral BID  . ziprasidone  80 mg Oral BID WC   Infusions:    Assessment: [age] [facility] open wound on right foot concerning for dry gangrene and probable osteomyelitis on x-ray. She received one-time doses of vancomycin 1g and Zosyn 3.375g in the ED.   Day #3 Vanc and Zosyn.  Vanc trough 14.5mg /l on 750mg  IV q12h.  Afebrile  WBC 7.6  SCr wnl, CrCl ~4650ml/min.  Goal of Therapy:  Vancomycin trough level  15-20 mcg/ml  Plan:   Increase Vanc to 1g IV q12h.   Cont Zosyn 3.375g IV q8h (4 hour infusion time).  Measure Vanc trough at steady state.  Follow up renal fxn and culture results.  Charolotte Ekeom Jamari Moten, PharmD, pager [phone]. 06/18/2013,7:46 PM.

## 2013-06-18 NOTE — Care Management Note (Signed)
  Page 2 of 2   06/18/2013     5:50:36 PM   CARE MANAGEMENT NOTE 06/18/2013  Patient:  [name],Lamis   Account Number:  [id number]  Date Initiated:  06/18/2013  Documentation initiated by:  Colleen CanMANNING,Blue Ruggerio  Subjective/Objective Assessment:   dx rt foot infection     Action/Plan:   CM was advised by CSW that patient will be returning to Assisted Living facility but will need wound services set up with [facility].   Anticipated DC Date:  06/21/2013   Anticipated DC Plan:  ASSISTED LIVING / REST HOME  In-house referral  Clinical Social Worker      DC Associate Professorlanning Services  CM consult      [facility]  [facility]   Choice offered to / List presented to:          [facility]      [facility]  [facility]   Status of service:  Completed, signed off Medicare Important Message given?   (If response is "NO", the following Medicare IM given date fields will be blank) Date Medicare IM given:   Date Additional Medicare IM given:    Discharge Disposition:    Per UR Regulation:    If discussed at Long Length of Stay Meetings, dates discussed:    Comments:  06/18/2013 Colleen CanLinda Taige Housman BSN RN [phone] ][facility] rep notified of services request for wound care. [facility]- [phone] rep advised that they would be able to provide wound care for patient at AL. They [facility]. Pt will not discharge today.

## 2013-06-19 LAB — GLUCOSE, CAPILLARY
GLUCOSE-CAPILLARY: 114 mg/dL — AB (ref 70–99)
Glucose-Capillary: 119 mg/dL — ABNORMAL HIGH (ref 70–99)
Glucose-Capillary: 123 mg/dL — ABNORMAL HIGH (ref 70–99)

## 2013-06-19 NOTE — Progress Notes (Signed)
ANTIBIOTIC CONSULT NOTE - Follow up  Pharmacy Consult for Vancomycin  Indication: Osetomyelitis right foot  Please see previous progress note from Charolotte Ekeom Pickering on 06/18/13 for full details. Asked to comment on dosage for discharge.  Since received first dose of new regimen (1g q12h) this morning, recommend continuing this dose and check vancomycin trough in ~3 days at new steady state (goal is 15-20 mcg/ml).  Recommend checking trough and SCr weekly at SNF.  Loralee PacasErin Keinan Brouillet, PharmD, BCPS Pager: [phone] 06/19/2013 7:43 AM

## 2013-06-19 NOTE — Progress Notes (Addendum)
Clinical Social Work Department CLINICAL SOCIAL WORK PLACEMENT NOTE 06/19/2013  Patient:  [name],[name]  Account Number:  [id number] Admit date:  06/16/2013  Clinical Social Worker:  Doroteo GlassmanAMANDA SIMPSON, LCSWA  Date/time:  06/19/2013 03:47 PM  Clinical Social Work is seeking post-discharge placement for this patient at the following level of care:   SKILLED NURSING   (*CSW will update this form in Epic as items are completed)   06/19/2013  Patient/family provided with [facility] of Clinical Social Work's list of facilities offering this level of care within the geographic area requested by the patient (or if unable, by the patient's family).  06/19/2013  Patient/family informed of their freedom to choose among providers that offer the needed level of care, that participate in Medicare, Medicaid or managed care program needed by the patient, have an available bed and are willing to accept the patient.  06/19/2013  Patient/family informed of [facility]' ownership interest in [facility], as well as of the fact that they are under no obligation to receive care at this facility.  PASARR submitted to EDS on 06/19/2013 PASARR number received from EDS on 06/19/2013  FL2 transmitted to all facilities in geographic area requested by pt/family on  06/19/2013 FL2 transmitted to all facilities within larger geographic area on   Patient informed that his/her managed care company has contracts with or will negotiate with  certain facilities, including the following:     Patient/family informed of bed offers received:  06/21/2012 [facility] Physician recommends and patient chooses bed at    Patient to be transferred to  [facility] on 06/22/2013 Patient to be transferred to facility by ambulance [name](PTAR)  The following physician request were entered in Epic:   Additional Comments:  Providence CrosbyAmanda Simpson, Theresia MajorsLCSWA Clinical Social  Work [phone]   Jacklynn LewisSuzanna Byrd, MSW, LCSW Clinical Social Work [phone]

## 2013-06-19 NOTE — Progress Notes (Signed)
[facility] PROGRESS NOTE  Cory MunchStella Donn ZOX:[id number] DOB: 03-Sep-1934 DOA: 06/16/2013 PCP: Ron ParkerBOWEN,SAMUEL, MD  Brief narrative: 79 y.o. Female [facility] SNF with right foot ulcer with drainage and concern for possible infection. She was subsequently found to have possible osteomyelitis based on x ray findings.   Assessment/Plan:   Principal Problem:  Osteomyelitis, right foot  - continue vanco and discontinued zosyn - ortho states can be discharge to her nursing center with wound care daily to her right foot. [facility]2 weeks. She will need to be continued on antibiotics.  - her prior ALF cannot give IV abx , so SNF search will be started by SW ( I personally relayed this message )  - PICC to be placed today  Active Problems:  Dementia  - stable  Schizophrenia  - on divalproex   Code Status: full code  Family Communication: no family at the bedside  Disposition Plan:  SNF placement. Needs IV abx , so PICC being placed today    Consultants:  Orthopedic surgery Procedures:  I&D 06/17/2013 Antibiotics:  vanco 06/16/2013 -->  Zosyn 06/16/2013 -->06/18/2013   Alysia PennaHOLWERDA, Jaydalyn Demattia, MD  [facility] Pager [phone]  If 7PM-7AM, please contact night-coverage [email or web address] Password [email or web address] 06/19/2013, 9:16 AM   LOS: 3 days    HPI/Subjective: No acute overnight events. Speaking in 3rd person this AM  Objective: Filed Vitals:   06/18/13 0458 06/18/13 1400 06/18/13 2211 06/19/13 0452  BP: 124/84 120/68 117/60 125/69  Pulse: 78 80 87 77  Temp: 97.9 F (36.6 C) 97.5 F (36.4 C) 98.2 F (36.8 C) 97.7 F (36.5 C)  TempSrc: Oral Oral Oral Oral  Resp: 16 18 20 20   Height:      Weight:      SpO2: 97% 98% 97% 97%    Intake/Output Summary (Last 24 hours) at 06/19/13 0916 Last data filed at 06/19/13 0342  Gross per 24 hour  Intake    800 ml  Output   1200 ml  Net   -400 ml    Exam:   General:  Pt  is alert, follows commands appropriately, not in acute distress  Cardiovascular: Regular rate and rhythm, S1/S2 appreciated   Respiratory: Clear to auscultation bilaterally, no wheezing, no crackles, no rhonchi  Abdomen: Soft, non tender, non distended, bowel sounds present, no guarding  Extremities: right foot in dressing in place, pulses DP and PT palpable bilaterally  Neuro: Grossly nonfocal  Data Reviewed: Basic Metabolic Panel:  Recent Labs Lab 06/16/13 1700 06/18/13 1757  NA 140 140  K 4.6 4.1  CL 102 100  CO2 29 27  GLUCOSE 131* 127*  BUN 15 16  CREATININE 0.83 0.90  CALCIUM 9.3 9.0   Liver Function Tests: No results found for this basename: AST, ALT, ALKPHOS, BILITOT, PROT, ALBUMIN,  in the last 168 hours No results found for this basename: LIPASE, AMYLASE,  in the last 168 hours No results found for this basename: AMMONIA,  in the last 168 hours CBC:  Recent Labs Lab 06/16/13 1700  WBC 7.6  NEUTROABS 4.7  HGB 12.0  HCT 37.5  MCV 90.8  PLT 278   Cardiac Enzymes: No results found for this basename: CKTOTAL, CKMB, CKMBINDEX, TROPONINI,  in the last 168 hours BNP: No components found with this basename: POCBNP,  CBG:  Recent Labs Lab 06/17/13 1557 06/17/13 2126 06/18/13 0829 06/18/13 1202 06/18/13 1639  GLUCAP 100* 120* 72 157* 98  No results found for this or any previous visit (from the past 240 hour(s)).   Studies: Dg Foot Complete Right 06/16/2013     IMPRESSION: Open wound containing air on the plantar aspect of the forefoot overlying the 1st metatarsal phalangeal joint.  Mild lucency in the 1st metatarsal head. Could not exclude early changes of osteomyelitis.    Scheduled Meds: . divalproex  500 mg Oral QHS  . enoxaparin (LOVENOX)   40 mg Subcutaneous QHS  . insulin aspart  0-9 Units Subcutaneous TID WC  . potassium chloride  10 mEq Oral Daily  . vancomycin  750 mg Intravenous Q12H  . venlafaxine  37.5 mg Oral BID  . ziprasidone   80 mg Oral BID WC

## 2013-06-19 NOTE — Progress Notes (Signed)
06/19/13 1558 IV team Attempted to contact POA  [name] for consent for PICC procedure. No one answers the phone, Bedside nursing staff aware and we will follow up in AM or if Family/POA shows up tonight. Geoffery SpruceSharpe, Amiri Tritch Matthey, RN

## 2013-06-19 NOTE — Progress Notes (Signed)
Per MD, Pt needing IV antibiotics.  Per facility, IVs cannot be accommodated.  MD notified.  CSW to discuss SNF search/placement with Pt/family.  Providence CrosbyAmanda Mical Brun, LCSWA Clinical Social Work [phone]

## 2013-06-19 NOTE — Progress Notes (Signed)
Spoke with Pt's sister via phone.  Pt's sister understood the need for SNF and gave CSW permission [facility].  CSW thanked Mrs. Bozeman for her time.  Providence CrosbyAmanda Sunny Gains, LCSWA Clinical Social Work [phone]

## 2013-06-20 LAB — GLUCOSE, CAPILLARY
GLUCOSE-CAPILLARY: 75 mg/dL (ref 70–99)
GLUCOSE-CAPILLARY: 111 mg/dL — AB (ref 70–99)
Glucose-Capillary: 145 mg/dL — ABNORMAL HIGH (ref 70–99)
Glucose-Capillary: 101 mg/dL — ABNORMAL HIGH (ref 70–99)
Glucose-Capillary: 164 mg/dL — ABNORMAL HIGH (ref 70–99)

## 2013-06-20 NOTE — Progress Notes (Signed)
[facility] PROGRESS NOTE  [name] ZOX:[id number] DOB: 1934-11-17 DOA: 06/16/2013 PCP: Ron Parker, MD  Brief narrative: 79 y.o. Female who presented to [facility] ED 06/16/2013 from ALF with right foot ulcer with drainage and concern for possible infection. She was subsequently found to have possible osteomyelitis based on x ray findings.   Assessment/Plan:   Principal Problem:  Osteomyelitis, right foot  - on vanc 1gm q12 hours  w/ trough needed 1/6 and goal of 15-20. Pt will need PICC placed Monday and have [place] ortho f/u scheduled in 1-2 weeks. Will also need sCr drawn weekly. D/C to SNF when bed available  - ortho states can be discharge to her nursing center with wound care daily to her right foot.  daily soaks in dial-soapy water at the bedside followed by wet-to-dry dressing changes - her prior ALF cannot give IV abx , so SNF search  started by SW ( I personally relayed this message on 1/3 )  - PICC to be placed Monday  Active Problems:  Dementia  - stable  Schizophrenia  - on divalproex   Code Status: full code  Family Communication: no family at the bedside  Disposition Plan:  SNF placement pending and PICC placement on Monday     Consultants:  Orthopedic surgery Procedures:  I&D 06/17/2013 Antibiotics:  vanco 06/16/2013 -->  Zosyn 06/16/2013 -->06/18/2013   Alysia Penna, MD  [facility] Pager [phone]  If 7PM-7AM, please contact night-coverage [email or web address] Password [email or web address] 06/20/2013, 9:27 AM   LOS: 4 days    HPI/Subjective: No events overnight  Objective: Filed Vitals:   06/19/13 0452 06/19/13 1412 06/19/13 2038 06/20/13 0552  BP: 125/69 110/64 109/63 127/72  Pulse: 77 83 78 80  Temp: 97.7 F (36.5 C) 97.8 F (36.6 C) 97.7 F (36.5 C) 98 F (36.7 C)  TempSrc: Oral Oral Oral Oral  Resp: 20 18 18 18   Height:      Weight:      SpO2: 97% 100% 96% 97%    Intake/Output Summary (Last 24 hours) at 06/20/13 0927 Last data filed at  06/20/13 0842  Gross per 24 hour  Intake   1286 ml  Output   1301 ml  Net    -15 ml    Exam:   General:  Pt is alert, follows commands appropriately, not in acute distress  Cardiovascular: Regular rate and rhythm, S1/S2 appreciated   Respiratory: Clear to auscultation bilaterally, no wheezing, no crackles, no rhonchi  Abdomen: Soft, non tender, non distended, bowel sounds present, no guarding  Extremities: right foot in dressing in place, pulses DP and PT palpable bilaterally  Neuro: Grossly nonfocal  Data Reviewed: Basic Metabolic Panel:  Recent Labs Lab 06/16/13 1700 06/18/13 1757  NA 140 140  K 4.6 4.1  CL 102 100  CO2 29 27  GLUCOSE 131* 127*  BUN 15 16  CREATININE 0.83 0.90  CALCIUM 9.3 9.0   Liver Function Tests: No results found for this basename: AST, ALT, ALKPHOS, BILITOT, PROT, ALBUMIN,  in the last 168 hours No results found for this basename: LIPASE, AMYLASE,  in the last 168 hours No results found for this basename: AMMONIA,  in the last 168 hours CBC:  Recent Labs Lab 06/16/13 1700  WBC 7.6  NEUTROABS 4.7  HGB 12.0  HCT 37.5  MCV 90.8  PLT 278   Cardiac Enzymes: No results found for this basename: CKTOTAL, CKMB, CKMBINDEX, TROPONINI,  in the last 168 hours BNP: No components found with this basename:  POCBNP,  CBG:  Recent Labs Lab 06/19/13 0832 06/19/13 1144 06/19/13 1657 06/19/13 2036 06/20/13 0750  GLUCAP 114* 119* 123* 145* 75    No results found for this or any previous visit (from the past 240 hour(s)).   Studies: Dg Foot Complete Right 06/16/2013     IMPRESSION: Open wound containing air on the plantar aspect of the forefoot overlying the 1st metatarsal phalangeal joint.  Mild lucency in the 1st metatarsal head. Could not exclude early changes of osteomyelitis.    Scheduled Meds: . divalproex  500 mg Oral QHS  . enoxaparin (LOVENOX)   40 mg Subcutaneous QHS  . insulin aspart  0-9 Units Subcutaneous TID WC  .  potassium chloride  10 mEq Oral Daily  . vancomycin  750 mg Intravenous Q12H  . venlafaxine  37.5 mg Oral BID  . ziprasidone  80 mg Oral BID WC

## 2013-06-20 NOTE — Progress Notes (Signed)
Asked by RN to meet with Pt to answer SNF questions.  CSW met with Pt and was informed by Pt that she wants to go to [place] upon d/c to be closer to her son.    CSW discussed with Pt the need for her to go to SNF, due to the IV antibiotics; Pt was agreeable.  Pt asked that CSW explore [place] SNFs.  CSW expanded the SNF search to include [place]  CSW thanked Pt for her time.  Bernita Raisin, [place] Work [phone]'

## 2013-06-20 NOTE — Progress Notes (Signed)
Patient ID: [name], female   DOB: 08-07-1934, 79 y.o.   MRN: [id number] Dressing changed right great toe MTP joint medially with red area of tissue, has a significant Hallux valgus which may make healing difficult due to persisting pressure from shoes. For now daily dressing changes NS wet to dry. Follow up with Dr. Magnus IvanBlackman in 2 weeks.

## 2013-06-21 ENCOUNTER — Inpatient Hospital Stay (HOSPITAL_COMMUNITY): Payer: Medicare Other

## 2013-06-21 LAB — GLUCOSE, CAPILLARY
GLUCOSE-CAPILLARY: 73 mg/dL (ref 70–99)
GLUCOSE-CAPILLARY: 122 mg/dL — AB (ref 70–99)
Glucose-Capillary: 94 mg/dL (ref 70–99)
Glucose-Capillary: 144 mg/dL — ABNORMAL HIGH (ref 70–99)

## 2013-06-21 MED ORDER — VANCOMYCIN HCL IN DEXTROSE 750-5 MG/150ML-% IV SOLN
750.0000 mg | Freq: Two times a day (BID) | INTRAVENOUS | Status: DC
  Administered 2013-06-22: 750 mg via INTRAVENOUS
  Filled 2013-06-21 (×2): qty 150

## 2013-06-21 NOTE — Procedures (Signed)
LUE PICC 51 cm SVC RA No comp

## 2013-06-21 NOTE — Progress Notes (Addendum)
Chaplain consulted with pt at nursing request.  Pt understands herself as prophet through whom god speaks.  Is refusing lab draws.   PT initially accepting of chaplain presence and proceeded to describe her sense of hearing god speak.  Pt minimally able to reflect on when she first felt this.   Pt then moved toward instructing chaplain to find large print NIV bible.  Chaplain informed pt that he would be able to provide Amalia HaileyGideon bibles that the hospital has in house.  Asked pt if she would like a bible.  Pt not interested in this and continued to cycle around "god's command for chaplain to buy her a bible in order for the miracle to occur."  If chaplain could not do this, then she could not talk with him.  Pt was able to reflect minimally on nursing care and express how she felt cared for, but not able to move toward speaking about blood draws.  Pt aware that she is discharging tomorrow.  Chaplain will seek NIV bible for pt in house.

## 2013-06-21 NOTE — Progress Notes (Signed)
CSW continuing to follow for disposition planning.  CSW noted that pt now needing SNF placement due to need for IV antibiotics at discharge.  CSW met with pt at bedside and contacted pt sister and POA via telephone to discuss SNF bed offers.  Currently, pt has bed offer from [facility]. Pt and pt sister were agreeable to [facility] as it is in close proximity to pt family.   CSW contacted [place] to notify of pt and pt family acceptance of bed offers.  Per RN report, awaiting PICC line placement before pt can be transitioned to SNF and PICC line will likely not be placed until tomorrow 1/6.  CSW to continue to follow and facilitate pt discharge needs to [facility] when pt medically ready for discharge.  Drake Leach, MSW, [place] Social Work [phone]

## 2013-06-21 NOTE — Progress Notes (Addendum)
ANTIBIOTIC CONSULT NOTE - Follow up  Pharmacy Consult for Vancomycin Indication:  Probable Osetomyelitis right foot  No Known Allergies  Patient Measurements: Height: 4\' 9"  (144.8 cm) Weight: 195 lb 12.3 oz (88.8 kg) IBW/kg (Calculated) : 38.6  Vital Signs: Temp: 97.9 F (36.6 C) (01/05 0644) Temp src: Oral (01/05 0644) BP: 120/74 mmHg (01/05 0644) Pulse Rate: 83 (01/05 0644)  01/04 0701 - 01/05 0700 In: 692 [P.O.:240; I.V.:252; IV Piggyback:200] Out: 1400 [Urine:1400] Intake/Output from this shift: Total I/O In: 340 [P.O.:240; I.V.:100] Out: 300 [Urine:300]  Labs:  Recent Labs  06/18/13 1757  CREATININE 0.90   Estimated Creatinine Clearance: 49.3 ml/min (by C-G formula based on Cr of 0.9).  Recent Labs  06/18/13 1757  VANCOTROUGH 14.5    Microbiology: No results found for this or any previous visit (from the past 720 hour(s)).  Medical History: Past Medical History  Diagnosis Date  . Alzheimer's dementia   . Schizophrenia   . Tinea pedis   . Bilateral lower extremity edema   . Hypokalemia    Medications:  Scheduled:  . divalproex  500 mg Oral QHS  . enoxaparin (LOVENOX) injection  40 mg Subcutaneous QHS  . insulin aspart  0-9 Units Subcutaneous TID WC  . loratadine  10 mg Oral Daily   And  . pseudoephedrine  120 mg Oral BID  . potassium chloride  10 mEq Oral Daily  . sodium chloride  3 mL Intravenous Q12H  . vancomycin  1,000 mg Intravenous Q12H  . venlafaxine  37.5 mg Oral BID  . ziprasidone  80 mg Oral BID WC   Assessment: 77 [facility]F [facility] with open wound on right foot concerning for dry gangrene and probable osteomyelitis on x-ray. Received one-time doses of vancomycin 1g and Zosyn 3.375g in the ED.  Received Vanc 1gm 12/31, then 4 doses of 750mg  q12, trough obtained 1/2 = 14.5 mcg/ml. Dose increased to 1gm q12 oln 1/3 and have been attempting to repeat trough to assess accumulation of antibiotic  Patient is refusing  lab draws so unable to assess repeat Vancomycin trough (see chaplain note 1/5)  Aiming for trough of 15-20 mcg/ml with possibility of osteomyelitis, first trough was close to goal range and can anticipate accumulation of medication  SCr has been stable during admission, clearance ~ 50 ml/min                       Goal of Therapy:  Vancomycin trough level 15-20 mcg/ml  Plan:   Will empirically reduce Vancomycin back to 750mg  q12 with am dose tomorrow to avoid toxicity.  If patient will consent to lab draw in the morning, can obtain trough to check Vancomycin level  Otho BellowsGreen, Cressie Betzler L PharmD Pager [phone] 06/21/2013, 2:55 PM

## 2013-06-21 NOTE — Progress Notes (Signed)
Patient ID: [name], female   DOB: 1935-05-19, 79 y.o.   MRN: [id number] [facility] PROGRESS NOTE  [name] WUJ:[id number] DOB: [birth year] DOA: 06/16/2013 PCP: Ron Parker, MD  Brief narrative: 79 y.o. Female who presented to [facility] ED 06/16/2013 from ALF with right foot ulcer with drainage and concern for possible infection. She was subsequently found to have possible osteomyelitis based on x ray findings.   Assessment/Plan:  Principal Problem:  Osteomyelitis, right foot  - on vanc 1gm q12 hours w/ trough needed 1/6 and goal of 15-20. Pt will need to have [place] ortho f/u scheduled in 1-2 weeks.  - plan on PICC line placement - Will also need sCr drawn weekly. D/C to SNF when bed available  - ortho states can be discharge to her nursing center with wound care daily to her right foot. daily soaks in dial-soapy water at the bedside followed by wet-to-dry dressing changes  - her prior ALF cannot give IV abx , so SNF search started by SW, work in progress  Active Problems:  Dementia  - stable  Schizophrenia  - on divalproex   Code Status: full code  Family Communication: no family at the bedside  Disposition Plan: SNF placement pending   Consultants:  Orthopedic surgery Procedures:  I&D 06/17/2013 Antibiotics:  vanco 06/16/2013 -->  Zosyn 06/16/2013 -->06/18/2013  Manson Passey, MD  [facility] Pager [phone]  If 7PM-7AM, please contact night-coverage [email or web address] Password [facility] 06/21/2013, 6:57 AM   LOS: 5 days   HPI/Subjective: No events overnight.  Objective: Filed Vitals:   06/20/13 0552 06/20/13 1509 06/20/13 2027 06/21/13 0644  BP: 127/72 109/61 117/74 120/74  Pulse: 80 86 88 83  Temp: 98 F (36.7 C) 97.9 F (36.6 C) 98.4 F (36.9 C) 97.9 F (36.6 C)  TempSrc: Oral Oral Oral Oral  Resp: 18 18 16 16   Height:      Weight:      SpO2: 97% 100% 98% 100%    Intake/Output Summary (Last 24 hours) at 06/21/13 0657 Last data filed at 06/21/13  0400  Gross per 24 hour  Intake    692 ml  Output   1150 ml  Net   -458 ml    Exam:   General:  Pt is alert, oriented to her self only  Cardiovascular: Regular rate and rhythm, S1/S2 appreciated   Respiratory: Clear to auscultation bilaterally, no wheezing, no crackles, no rhonchi  Abdomen: Soft, non tender, non distended, bowel sounds present, no guarding  Extremities: No edema,  LLE in dressing foot and ankle, pulses DP and PT palpable bilaterally  Neuro: Grossly nonfocal  Data Reviewed: Basic Metabolic Panel:  Recent Labs Lab 06/16/13 1700 06/18/13 1757  NA 140 140  K 4.6 4.1  CL 102 100  CO2 29 27  GLUCOSE 131* 127*  BUN 15 16  CREATININE 0.83 0.90  CALCIUM 9.3 9.0   CBC:  Recent Labs Lab 06/16/13 1700  WBC 7.6  NEUTROABS 4.7  HGB 12.0  HCT 37.5  MCV 90.8  PLT 278   CBG:  Recent Labs Lab 06/19/13 2036 06/20/13 0750 06/20/13 1146 06/20/13 1715 06/20/13 2013  GLUCAP 145* 75 101* 164* 111*   Studies: No results found.  Scheduled Meds: . divalproex  500 mg Oral QHS  . enoxaparin (LOVENOX) injection  40 mg Subcutaneous QHS  . insulin aspart  0-9 Units Subcutaneous TID WC  . loratadine  10 mg Oral Daily   And  . pseudoephedrine  120 mg Oral BID  .  potassium chloride  10 mEq Oral Daily  . sodium chloride  3 mL Intravenous Q12H  . vancomycin  1,000 mg Intravenous Q12H  . venlafaxine  37.5 mg Oral BID  . ziprasidone  80 mg Oral BID WC   Continuous Infusions:

## 2013-06-21 NOTE — Progress Notes (Signed)
At 0330 am, Lab arrived to draw patient's Vancomycin trough.  Patient adamantly refused despite a long conversation/education.  Patient reports that she understands why it is important but that God does not want her blood to be drawn.  MD notified and order received to hang vancomycin despite inability to get labs drawn.  Will continue to monitor patient.Bonnie KingfisherMills, [name]

## 2013-06-22 LAB — GLUCOSE, CAPILLARY
GLUCOSE-CAPILLARY: 86 mg/dL (ref 70–99)
Glucose-Capillary: 100 mg/dL — ABNORMAL HIGH (ref 70–99)

## 2013-06-22 LAB — VANCOMYCIN, TROUGH: VANCOMYCIN TR: 21.4 ug/mL — AB (ref 10.0–20.0)

## 2013-06-22 MED ORDER — ZOLPIDEM TARTRATE 5 MG PO TABS: 5.0000 mg | ORAL_TABLET | Freq: Every evening | ORAL | Status: DC | PRN

## 2013-06-22 MED ORDER — HYDROCODONE-ACETAMINOPHEN 5-325 MG PO TABS: 1.0000 | ORAL_TABLET | ORAL | Status: DC | PRN

## 2013-06-22 MED ORDER — ONDANSETRON HCL 4 MG PO TABS: 4.0000 mg | ORAL_TABLET | Freq: Four times a day (QID) | ORAL | Status: DC | PRN

## 2013-06-22 MED ORDER — VANCOMYCIN HCL IN DEXTROSE 750-5 MG/150ML-% IV SOLN: 750.0000 mg | Freq: Two times a day (BID) | INTRAVENOUS | Status: AC

## 2013-06-22 MED ORDER — ZIPRASIDONE HCL 80 MG PO CAPS: 80.0000 mg | ORAL_CAPSULE | Freq: Two times a day (BID) | ORAL | Status: AC

## 2013-06-22 NOTE — Discharge Instructions (Signed)
Bone and Joint Infections °Joint infections are called septic or infectious arthritis. An infected joint may damage cartilage and tissue very quickly. This may destroy the joint. Bone infections (osteomyelitis) may last for years. Joints may become stiff if left untreated. Bacteria are the most common cause. Other causes include viruses and fungi, but these are more rare. Bone and joint infections usually come from injury or infection elsewhere in your body; the germs are carried to your bones or joints through the bloodstream.  °CAUSES  °· Blood-carried germs from an infection elsewhere in your body can eventually spread to a bone or joint. The germ staphylococcus is the most common cause of both osteomyelitis and septic arthritis. °· An injury can introduce germs into your bones or joints. °SYMPTOMS  °· Weight loss. °· Tiredness. °· Chills and fever. °· Bone or joint pain at rest and with activity. °· Tenderness when touching the area or bending the joint. °· Refusal to bear weight on a leg or inability to use an arm due to pain. °· Decreased range of motion in a joint. °· Skin redness, warmth, and tenderness. °· Open skin sores and drainage. °RISK FACTORS °Children, the elderly, and those with weak immune systems are at increased risk of bone and joint infections. It is more common in people with HIV infections and with people on chemotherapy. People are also at increased risk if they have surgery where metal implants are used to stabilize the bone. Plates, screws, or artificial joints provide a surface that bacteria can stick on. Such a growth of bacteria is called biofilm. The biofilm protects bacteria from antibiotics and bodily defenses. This allows germs to multiply. Other reasons for increased risks include:  °· Having previous surgery or injury of a bone or joint. °· Being on high-dose corticosteroids and immunosuppressive medications that weaken your body's resistance to germs. °· Diabetes and  long-standing diseases. °· Use of intravenous street drugs. °· Being on hemodialysis. °· Having a history of urinary tract infections. °· Removal of your spleen (splenectomy). This weakens your immunity. °· Chronic viral infections such as HIV or AIDS. °· Lack of sensation such as paraplegia, quadriplegia, or spina bifida. °DIAGNOSIS  °· Increased numbers of white blood cells in your blood may indicate infection. Some times your caregivers are able to identify the infecting germs by testing your blood. Inflammatory markers present in your bloodstream such as an erythrocyte sedimentation rate (ESR or sed rate) or c-reactive protein (CRP) can be indicators of deep infection. °· Bone scans and X-ray exams are necessary for diagnosing osteomyelitis. They may help your caregiver find the infected areas. Other studies may give more detailed information. They may help detect fluid collections around a joint, abnormal bone surfaces, or be useful in diagnosing septic arthritis. They can find soft tissue swelling and find excess fluid in an infected joint or the adjacent bone. These tests include: °· Ultrasound. °· CT (computerized tomography). °· MRI (magnetic resonance imaging). °· The best test for diagnosing a bone or joint infection is an aspiration or biopsy. Your caregiver will usually use a local anesthetic. He or she can then remove tissue from a bone injury or use a needle to take fluids from an infected joint. A local anesthetic medication numbs the area to be biopsied. Often biopsies are done in the operating room under general anesthesia. This means you will be asleep during the procedure. Tests performed on these samples can identify an infection. °TREATMENT  °· Treatment can help control long-standing   infections, but infections may come back. °· Infections can infect any bone or joint at any age. °· Bone and joint infections are rarely fatal. °· Bone infection left untreated can become a never-ending infection.  It can spread to other areas of your body. It may eventually cause bone death. Reduced limb or joint function can result. In severe cases, this may require removal of a limb. Spinal osteomyelitis is very dangerous. Untreated, it may damage spinal nerves and cause death. °· The most common complication of septic arthritis is osteoarthritis with pain and decreased range of motion of the joint. °Some forms of treatment may include: °· If the infection is caused by bacteria, it is generally treated with antibiotics. You will likely receive the drugs through a vein (intravenously) for anywhere from 2 to 6 weeks. In some cases, especially with children, oral antibiotics following an initial intravenous dose may be effective. The treatment you receive depends on the: °· Type of bacteria. °· Location of the infection. °· Type of surgery that might be done. °· Other health conditions or issues you might have. °· Your caregiver may drain soft tissue abscesses or pockets of fluid around infected bones or joints. If you have septic arthritis, your caregiver may use a needle to drain pus from the joint on a daily basis. He or she may use an arthroscope to clean the joint or may need to open the joint surgically to remove damaged tissue and infection. An arthroscope is an instrument like a thin lighted telescope. It can be used to look inside the joint. °· Surgery is usually needed if the infection has become long-standing. It may also be needed if there is hardware (such as metal plates, screws, or artificial joints) inside the patient. Sometimes a bone or muscle graft is needed to fill in the open space. This promotes growth of new tissues and better blood flow to the area. °PREVENTION  °· Clean and disinfect wounds quickly to help prevent the start of a bone or joint infection. Get treatment for any infections to prevent spread to a bone or joint. °· Do not smoke. Smoking decreases healing rates of bone and predisposes to  infection. °· When given medications that suppress your immune system, use them according to your caregiver's instructions. Do not take more than prescribed for your condition. °· Take good care of your feet and skin, especially if you have diabetes, decreased sensation or circulation problems. °SEEK IMMEDIATE MEDICAL CARE IF:  °· You cannot bear weight on a leg or use an arm, especially following a minor injury. This can be a sign of bone or joint infection. °· You think you may have signs or symptoms of a bone or joint infection. Your chance of getting rid of an infection is better if treated early. °Document Released: 06/03/2005 Document Revised: 08/26/2011 Document Reviewed: 05/03/2009 °Exit[employer]® Patient Information ©2014 [employer]. ° °

## 2013-06-22 NOTE — Progress Notes (Signed)
Subjective: Patient states right foot doing well.    Objective: Vital signs in last 24 hours: Temp:  [97.8 F (36.6 C)-98.1 F (36.7 C)] 97.8 F (36.6 C) (01/06 0628) Pulse Rate:  [84-89] 88 (01/06 0628) Resp:  [16-18] 18 (01/06 0628) BP: (106-144)/(58-84) 144/84 mmHg (01/06 0628) SpO2:  [98 %-100 %] 100 % (01/06 0628)  Intake/Output from previous day: 01/05 0701 - 01/06 0700 In: 940 [P.O.:840; I.V.:100] Out: 1500 [Urine:1500] Intake/Output this shift:    No results found for this basename: HGB,  in the last 72 hours No results found for this basename: WBC, RBC, HCT, PLT,  in the last 72 hours No results found for this basename: NA, K, CL, CO2, BUN, CREATININE, GLUCOSE, CALCIUM,  in the last 72 hours No results found for this basename: LABPT, INR,  in the last 72 hours  Neurovascular intact Right foot bunion eminence area with good granular tissue no signs of infection.  Assessment/Plan: Continue current care  Follow up with Dr. Magnus IvanBlackman in office in 10- 14 days.   Richardean CanalCLARK, GILBERT 06/22/2013, 10:55 AM

## 2013-06-22 NOTE — Progress Notes (Signed)
Pt for discharge to [facility].  CSW facilitated pt discharge needs including contacting facility, faxing pt discharge information to [facility], discussing with pt at bedside and pt sister via telephone, providing RN phone number to call report, and arranging ambulance transport for pt to [facility]. (Service Request ID#: [id number]).  No further social work needs identified at this time.  CSW signing off.   Jacklynn LewisSuzanna Byrd, MSW, LCSW Clinical Social Work [phone]

## 2013-06-22 NOTE — Progress Notes (Signed)
06/22/13 1316 Spoke with nurse Deanna ArtisKeisha. Gave report.

## 2013-06-22 NOTE — Progress Notes (Signed)
ANTIBIOTIC CONSULT NOTE - Follow up  Pharmacy Consult for Vancomycin Indication:  Probable Osetomyelitis right foot  No Known Allergies  Patient Measurements: Height: 4\' 9"  (144.8 cm) Weight: 195 lb 12.3 oz (88.8 kg) IBW/kg (Calculated) : 38.6  Vital Signs: Temp: 98.1 F (36.7 C) (01/05 2149) Temp src: Oral (01/05 2149) BP: 106/63 mmHg (01/05 2149) Pulse Rate: 89 (01/05 2149)  01/05 0701 - 01/06 0700 In: 940 [P.O.:840; I.V.:100] Out: 900 [Urine:900] Intake/Output from this shift: Total I/O In: 360 [P.O.:360] Out: 200 [Urine:200]  Labs: No results found for this basename: WBC, HGB, PLT, LABCREA, CREATININE,  in the last 72 hours Estimated Creatinine Clearance: 49.3 ml/min (by C-G formula based on Cr of 0.9).  Recent Labs  06/22/13 0300  VANCOTROUGH 21.4*    Microbiology: No results found for this or any previous visit (from the past 720 hour(s)).  Medical History: Past Medical History  Diagnosis Date  . Alzheimer's dementia   . Schizophrenia   . Tinea pedis   . Bilateral lower extremity edema   . Hypokalemia    Medications:  Scheduled:  . divalproex  500 mg Oral QHS  . enoxaparin (LOVENOX) injection  40 mg Subcutaneous QHS  . insulin aspart  0-9 Units Subcutaneous TID WC  . loratadine  10 mg Oral Daily   And  . pseudoephedrine  120 mg Oral BID  . potassium chloride  10 mEq Oral Daily  . sodium chloride  3 mL Intravenous Q12H  . vancomycin  750 mg Intravenous Q12H  . venlafaxine  37.5 mg Oral BID  . ziprasidone  80 mg Oral BID WC   Assessment: 77 [facility]F [facility] with open wound on right foot concerning for dry gangrene and probable osteomyelitis on x-ray. Received one-time doses of vancomycin 1g and Zosyn 3.375g in the ED.  Received Vanc 1gm 12/31, then 4 doses of 750mg  q12, trough obtained 1/2 = 14.5 mcg/ml. Dose increased to 1gm q12 oln 1/3 and have been attempting to repeat trough to assess accumulation of antibiotic  Patient  is refusing lab draws so unable to assess repeat Vancomycin trough (see chaplain note 1/5)  Aiming for trough of 15-20 mcg/ml with possibility of osteomyelitis, first trough was close to goal range and can anticipate accumulation of medication  SCr has been stable during admission, clearance ~ 50 ml/min, but pt refused Scr draw this am.  VT was drawn and was slightly elevated @ 21.4 mg/L.                       Goal of Therapy:  Vancomycin trough level 15-20 mcg/ml  Plan:   Continue Vancomycin  750mg  q12h. (already reduced from dose that contributed to high level)  F/u additional labs if needed  Lorenza EvangelistGreen, Dynver Clemson R 06/22/2013, 6:20 AM

## 2013-06-22 NOTE — Discharge Summary (Addendum)
Physician Discharge Summary  [name] ZOX:[id number] DOB: [birth year] DOA: 06/16/2013  PCP: Ron Parker, MD  Admit date: 06/16/2013 Discharge date: 06/22/2013  Recommendations for Outpatient Follow-up:  1. please note recommendations by orthopedic surgery noted under dressing/wound care section 2. continue vancomycin until and including 07/24/2013 for osteomyelitis 3. recheck Vanco trough (vanco through 15-20) once a week and adjust vancomycin dosage accordingly. Monitor for renal function per nursing home protocol. 4. PICC line care per nursing home protocol  Discharge wound care:  As directed   Comments:    Per ortho - wound care daily to her right foot. daily soaks in dial-soapy water at the bedside followed by wet-to-dry dressing changes    Discharge Diagnoses:  Active Problems:   Osteomyelitis   Osteomyelitis of foot, acute   Dementia   Schizophrenia    Discharge Condition: Medically stable for discharge to skilled nursing facility today  Diet recommendation: As tolerated  History of present illness:  79 y.o. Female who presented to [facility] ED 06/16/2013 from ALF with right foot ulcer with drainage and concern for possible infection. She was subsequently found to have possible osteomyelitis based on x ray findings.   Assessment/Plan:   Principal Problem:  Diabetic Osteomyelitis, right foot  - on vanc 1gm q12 hours w/ trough needed 2/7 and goal of vanco through 15-20 - PICC line care per nursing home protocol - follow up with ortho per scheduled appt - ortho states can be discharge to her nursing center with wound care daily to her right foot. daily soaks in dial-soapy water at the bedside followed by wet-to-dry dressing changes   Active Problems:  Dementia  - stable  Schizophrenia  - on divalproex   Code Status: full code  Family Communication: no family at the bedside   Consultants:  Orthopedic surgery Procedures:  I&D 06/17/2013 Antibiotics:  vanco  06/16/2013 --> 07/24/2013 Zosyn 06/16/2013 -->06/18/2013   Manson Passey, MD  [facility]  Pager [phone]     Signed:  Manson Passey, MD  [facility] 06/22/2013, 11:31 AM  Pager #: [phone]    Discharge Exam: Filed Vitals:   06/22/13 0628  BP: 144/84  Pulse: 88  Temp: 97.8 F (36.6 C)  Resp: 18   Filed Vitals:   06/21/13 1440 06/21/13 1510 06/21/13 2149 06/22/13 0628  BP: 115/58 112/67 106/63 144/84  Pulse: 84 84 89 88  Temp: 98.1 F (36.7 C) 98 F (36.7 C) 98.1 F (36.7 C) 97.8 F (36.6 C)  TempSrc: Oral Oral Oral Oral  Resp: 18 18 16 18   Height:      Weight:      SpO2: 98% 99% 98% 100%    General: Pt is alert, follows commands appropriately, not in acute distress Cardiovascular: Regular rate and rhythm, S1/S2 appreciated  Respiratory: Clear to auscultation bilaterally, no wheezing, no crackles, no rhonchi Abdominal: Soft, non tender, non distended, bowel sounds +, no guarding Extremities: Right foot, dressing in place, pulses palpable Neuro: Grossly nonfocal  Discharge Instructions  Discharge Orders   Future Orders Complete By Expires   Diet - low sodium heart healthy  As directed    Discharge instructions  As directed    Comments:     1. please note recommendations by orthopedic surgery noted under dressing/wound care section 2. continue vancomycin until and including 07/24/2013 for osteomyelitis 3. recheck Vanco trough once a week and adjust vancomycin dosage accordingly. Monitor for renal function per nursing home protocol. 4. PICC line care per nursing home protocol  Discharge wound care:  As directed    Comments:     Per ortho - wound care daily to her right foot. daily soaks in dial-soapy water at the bedside followed by wet-to-dry dressing changes   Increase activity slowly  As directed        Medication List         acetaminophen 500 MG tablet  Commonly known as:  TYLENOL  Take 500 mg by mouth every 4 (four) hours as  needed (fever/pain).     alum & mag hydroxide-simeth 200-200-20 MG/5ML suspension  Commonly known as:  MAALOX/MYLANTA  Take 30 mLs by mouth every 6 (six) hours as needed for indigestion or heartburn.     divalproex 500 MG 24 hr tablet  Commonly known as:  DEPAKOTE ER  Take 500 mg by mouth at bedtime.     guaiFENesin 100 MG/5ML Soln  Commonly known as:  ROBITUSSIN  Take 10 mLs by mouth every 6 (six) hours as needed for cough or to loosen phlegm (not to exceed 4 doses).     HYDROcodone-acetaminophen 5-325 MG per tablet  Commonly known as:  NORCO/VICODIN  Take 1-2 tablets by mouth every 4 (four) hours as needed for moderate pain.     KLOR-CON 10 10 MEQ tablet  Generic drug:  potassium chloride  Take 10 mEq by mouth daily.     loperamide 2 MG tablet  Commonly known as:  IMODIUM A-D  Take 2 mg by mouth 4 (four) times daily as needed for diarrhea or loose stools.     loratadine-pseudoephedrine 10-240 MG per 24 hr tablet  Commonly known as:  CLARITIN-D 24-hour  Take 1 tablet by mouth daily.     magnesium hydroxide 400 MG/5ML suspension  Commonly known as:  MILK OF MAGNESIA  Take 30 mLs by mouth at bedtime as needed for mild constipation.     ondansetron 4 MG tablet  Commonly known as:  ZOFRAN  Take 1 tablet (4 mg total) by mouth every 6 (six) hours as needed for nausea.     Vancomycin 750 MG/150ML Soln  Commonly known as:  VANCOCIN  Inject 150 mLs (750 mg total) into the vein every 12 (twelve) hours.     venlafaxine 37.5 MG tablet  Commonly known as:  EFFEXOR  Take 37.5 mg by mouth 2 (two) times daily.     ziprasidone 80 MG capsule  Commonly known as:  GEODON  Take 1 capsule (80 mg total) by mouth 2 (two) times daily with a meal.     zolpidem 5 MG tablet  Commonly known as:  AMBIEN  Take 1 tablet (5 mg total) by mouth at bedtime as needed for sleep (insomnia).           Follow-up Information   Follow up with Kathryne HitchBLACKMAN,CHRISTOPHER Y, MD In 2 weeks.   Specialty:   Orthopedic Surgery   Contact information:   [address] [place] [address] [phone]        The results of significant diagnostics from this hospitalization (including imaging, microbiology, ancillary and laboratory) are listed below for reference.    Significant Diagnostic Studies: Ir Fluoro Guide Cv Line Left  06/21/2013   CLINICAL DATA:  Osteomyelitis  EXAM: POWER PICC LINE PLACEMENT WITH ULTRASOUND AND FLUOROSCOPIC GUIDANCE  FLUOROSCOPY TIME:  18 seconds.  PROCEDURE: The patient was advised of the possible risks andcomplications and agreed to undergo the procedure. The patient was then brought to the angiographic suite for the procedure.  The left arm was prepped with chlorhexidine, drapedin the usual sterile fashion using maximum barrier technique (cap and mask, sterile gown, sterile gloves, large sterile sheet, hand hygiene and cutaneous antisepsis) and infiltrated locally with 1% Lidocaine.  Ultrasound demonstrated patency of the left basilic vein, and this was documented with an image. Under real-time ultrasound guidance, this vein was accessed with a 21 gauge micropuncture needle and image documentation was performed. A 0.018 wire was introduced in to the vein. Over this, a 5 [place] double lumen Power PICC was advanced to the lower SVC/right atrial junction. Fluoroscopy during the procedure and fluoro spot radiograph confirms appropriate catheter position. The catheter was flushed and covered with asterile dressing.  Complications: None.  IMPRESSION: Successful left arm Power PICC line placement with ultrasound and fluoroscopic guidance. The catheter is ready for use.   Electronically Signed   By: Maryclare Bean M.D.   On: 06/21/2013 15:35   Ir US Guide Vasc Access Left  06/21/2013   CLINICAL DATA:  Osteomyelitis  EXAM: POWER PICC LINE PLACEMENT WITH ULTRASOUND AND FLUOROSCOPIC GUIDANCE  FLUOROSCOPY TIME:  18 seconds.  PROCEDURE: The patient was advised of the possible risks  andcomplications and agreed to undergo the procedure. The patient was then brought to the angiographic suite for the procedure.  The left arm was prepped with chlorhexidine, drapedin the usual sterile fashion using maximum barrier technique (cap and mask, sterile gown, sterile gloves, large sterile sheet, hand hygiene and cutaneous antisepsis) and infiltrated locally with 1% Lidocaine.  Ultrasound demonstrated patency of the left basilic vein, and this was documented with an image. Under real-time ultrasound guidance, this vein was accessed with a 21 gauge micropuncture needle and image documentation was performed. A 0.018 wire was introduced in to the vein. Over this, a 5 [place] double lumen Power PICC was advanced to the lower SVC/right atrial junction. Fluoroscopy during the procedure and fluoro spot radiograph confirms appropriate catheter position. The catheter was flushed and covered with asterile dressing.  Complications: None.  IMPRESSION: Successful left arm Power PICC line placement with ultrasound and fluoroscopic guidance. The catheter is ready for use.   Electronically Signed   By: Maryclare Bean M.D.   On: 06/21/2013 15:35   Dg Foot Complete Right  06/16/2013   CLINICAL DATA:  Nonhealing wound.  EXAM: RIGHT FOOT COMPLETE - 3+ VIEW  COMPARISON:  None.  FINDINGS: Severe hallux valgus deformity with associated degenerative changes at the 1st metatarsal phalangeal joint. There is a wound containing air on the plantar aspect of the foot overlying the 1st metatarsal phalangeal joint. They lucency in the 1st metatarsal head could suggest subtle changes of osteomyelitis. No gross destructive bony changes. The remaining bony structures are intact. A moderate size calcaneal heel spur is noted. Moderate pes planus.  IMPRESSION: Open wound containing air on the plantar aspect of the forefoot overlying the 1st metatarsal phalangeal joint.  Mild lucency in the 1st metatarsal head. Could not exclude early changes of  osteomyelitis.   Electronically Signed   By: Loralie Champagne M.D.   On: 06/16/2013 16:46    Microbiology: No results found for this or any previous visit (from the past 240 hour(s)).   Labs: Basic Metabolic Panel:  Recent Labs Lab 06/16/13 1700 06/18/13 1757  NA 140 140  K 4.6 4.1  CL 102 100  CO2 29 27  GLUCOSE 131* 127*  BUN 15 16  CREATININE 0.83 0.90  CALCIUM 9.3 9.0   Liver Function Tests: No results found  for this basename: AST, ALT, ALKPHOS, BILITOT, PROT, ALBUMIN,  in the last 168 hours No results found for this basename: LIPASE, AMYLASE,  in the last 168 hours No results found for this basename: AMMONIA,  in the last 168 hours CBC:  Recent Labs Lab 06/16/13 1700  WBC 7.6  NEUTROABS 4.7  HGB 12.0  HCT 37.5  MCV 90.8  PLT 278   Cardiac Enzymes: No results found for this basename: CKTOTAL, CKMB, CKMBINDEX, TROPONINI,  in the last 168 hours BNP: BNP (last 3 results) No results found for this basename: PROBNP,  in the last 8760 hours CBG:  Recent Labs Lab 06/21/13 1136 06/21/13 1651 06/21/13 2147 06/22/13 0737 06/22/13 1124  GLUCAP 94 122* 144* 86 100*    Time coordinating discharge: Over 30 minutes

## 2013-09-30 ENCOUNTER — Encounter (HOSPITAL_BASED_OUTPATIENT_CLINIC_OR_DEPARTMENT_OTHER): Payer: Medicare Other | Attending: Internal Medicine

## 2013-09-30 DIAGNOSIS — G5790 Unspecified mononeuropathy of unspecified lower limb: Secondary | ICD-10-CM | POA: Insufficient documentation

## 2013-09-30 DIAGNOSIS — L89899 Pressure ulcer of other site, unspecified stage: Secondary | ICD-10-CM | POA: Insufficient documentation

## 2013-09-30 DIAGNOSIS — Z79899 Other long term (current) drug therapy: Secondary | ICD-10-CM | POA: Insufficient documentation

## 2013-09-30 DIAGNOSIS — F0390 Unspecified dementia without behavioral disturbance: Secondary | ICD-10-CM | POA: Insufficient documentation

## 2013-09-30 DIAGNOSIS — F209 Schizophrenia, unspecified: Secondary | ICD-10-CM | POA: Insufficient documentation

## 2013-09-30 DIAGNOSIS — L8990 Pressure ulcer of unspecified site, unspecified stage: Secondary | ICD-10-CM | POA: Insufficient documentation

## 2013-10-01 NOTE — Progress Notes (Signed)
[facility]  NAME:  [name], [name]               ACCOUNT NO.:  [id number]  MEDICAL RECORD NO.:  [id number]      DATE OF BIRTH:  30-Oct-1934  PHYSICIAN:  Maxwell CaulMichael G. Robson, M.D. VISIT DATE:  09/30/2013                                  OFFICE VISIT   CHIEF COMPLAINT:  Review of wound on the right foot.  HISTORY OF PRESENT ILLNESS:  This patient arrived with an attendant from her assisted living for our review of a wound over the right first toe metatarsophalangeal on the posterolateral aspect.  I note looking through [facility] that she was admitted to hospital from December 31 through January 6th.  She had presented with a wound on the foot in the same area. The x-ray of the area showed hallux valgus deformity and there was possible osteomyelitis noted.  She was seen by Dr. Magnus IvanBlackman, the wound was debrided.  I do not see any relevant cultures.  She was discharged on vancomycin 1 g q. 12 through July 24, 2013, and she was discharged to a facility [facility].  Apparently, she has home health going out to do dressings in the assisted living.  I do not really know what dressings they are doing at this point.  She arrives for our review of the wound.  PAST MEDICAL HISTORY/PROBLEM LIST: 1. Not a listed diabetic.  Although, she was listed as having diabetic     osteomyelitis in the hospital. 2. Schizophrenia. 3. Dementia.  Review of her lab work from her hospitalization in early January showed a normal BUN and creatinine.  At least on her lab work, her reference blood glucoses were 126 and 131.  X-ray showed an open wound containing air on the plantar aspect of the forefoot overlying the first metatarsophalangeal joint.  There was mild lucency in the first metatarsal head, could not exclude early changes of osteomyelitis.  CURRENT MEDICATIONS: 1. Acetaminophen 500 mg p.o. q.4 hours p.r.n. 2. Depakote ER dose uncertain. 3.  Norco/Vicodin 5/325 q.4 h. p.r.n. 4. Zofran 4 mg q.6 hours p.r.n. 5. K-Dur 10 mEq daily. 6. Effexor 37.5 b.i.d. 7. She is on 80 mg b.i.d. 8. Ambien 5 mg at bedtime p.r.n.  PHYSICAL EXAMINATION:  VITAL SIGNS:  Temperature is 97.4, pulse 81, respirations 16, blood pressure 132/72.  ABI in this clinic was measured at 0.91.  WOUND EXAM:  There is an extensive wound on the posterolateral aspect of the first right metatarsophalangeal joint.  This measures 1.5 x 2 x 2.5.  The wound probes widely.  The entire area has a degree of swelling and also the base of the first toe as well.  Her peripheral pulses were palpable.  I could not actually prove any degree of sensory loss although she seemed reasonably insensate to my entire exam of the wound area.  Note that she also has a considerable loss of vibration sense in both feet.  IMPRESSION: 1. Extensive wound over the right first metatarsophalangeal joint as     described.  I did a deep culture of this area, a lab work including     a C. reactive protein, sedimentation rates, CBC, diff, basic     metabolic panel.  I have asked for a plain x-ray of this  area to     follow up on the changes noted 3 months ago.  I am not completely     certain of the etiology of this wound, although it would not     surprise me if she does not have some form of peripheral     neuropathy.  I did start her on doxycycline and ciprofloxacin     empirically. 2. Schizophrenia.  The patient started on a wide discussion about god     guiding her life and making her decisions.  I do not think this is     particularly stable and I think it would really make competency to     make medical decisions questionable. 3. Dementia.  I do not think this is that severe through her     circumferential way of looking at things.  She was able to talk     about the history here including her hospitalization and her trip     to the nursing home in January.  Nevertheless, I think  her     schizophrenia probably caused her ability to make proper medical     decisions.  I have done the lab work as noted including a culture and x-ray.  We are going to dress this with silver alginate, and we have called these orders in to Home Health.  I have given her an actual prescription for a wheelchair to avoid her walking around on this.  We fitted her for a healing sandal as the one she has do not seem to actually fit her foot.  My impression is that this is not going to be easily fixed with antibiotics.  I suspect this is going to need surgical intervention, and ultimately I think this is going to need a ray amputation.  I do not think this is going to be amenable to medical issues including antibiotics and dressings.  I have asked for the facility to work on getting somebody in with her next week.  She apparently has a sister and a son.  I am not sure of their involvement with her care but I would like somebody to talk to next week before referring her elsewhere.          ______________________________ Maxwell CaulMichael G. Robson, M.D.     MGR/MEDQ  D:  09/30/2013  T:  10/01/2013  Job:  [id number]

## 2013-10-20 ENCOUNTER — Other Ambulatory Visit (HOSPITAL_COMMUNITY): Payer: Self-pay | Admitting: Internal Medicine

## 2013-10-20 DIAGNOSIS — M79609 Pain in unspecified limb: Secondary | ICD-10-CM

## 2013-10-20 DIAGNOSIS — I739 Peripheral vascular disease, unspecified: Secondary | ICD-10-CM

## 2013-10-20 DIAGNOSIS — S81809A Unspecified open wound, unspecified lower leg, initial encounter: Secondary | ICD-10-CM

## 2013-10-21 ENCOUNTER — Encounter (HOSPITAL_BASED_OUTPATIENT_CLINIC_OR_DEPARTMENT_OTHER): Payer: Medicare Other | Attending: Internal Medicine

## 2013-10-21 ENCOUNTER — Ambulatory Visit (HOSPITAL_COMMUNITY)
Admission: RE | Admit: 2013-10-21 | Discharge: 2013-10-21 | Disposition: A | Discharge status: Home / Self Care | Payer: Medicare Other | Source: Ambulatory Visit | Attending: Internal Medicine | Admitting: Internal Medicine

## 2013-10-21 DIAGNOSIS — M86679 Other chronic osteomyelitis, unspecified ankle and foot: Secondary | ICD-10-CM | POA: Diagnosis not present

## 2013-10-21 DIAGNOSIS — L84 Corns and callosities: Secondary | ICD-10-CM | POA: Insufficient documentation

## 2013-10-21 DIAGNOSIS — M86179 Other acute osteomyelitis, unspecified ankle and foot: Secondary | ICD-10-CM | POA: Insufficient documentation

## 2013-10-21 DIAGNOSIS — L97509 Non-pressure chronic ulcer of other part of unspecified foot with unspecified severity: Secondary | ICD-10-CM | POA: Insufficient documentation

## 2013-10-21 DIAGNOSIS — M869 Osteomyelitis, unspecified: Secondary | ICD-10-CM

## 2013-10-21 DIAGNOSIS — L89899 Pressure ulcer of other site, unspecified stage: Secondary | ICD-10-CM | POA: Insufficient documentation

## 2013-10-21 DIAGNOSIS — I739 Peripheral vascular disease, unspecified: Secondary | ICD-10-CM

## 2013-10-21 DIAGNOSIS — G5790 Unspecified mononeuropathy of unspecified lower limb: Secondary | ICD-10-CM | POA: Diagnosis not present

## 2013-10-21 DIAGNOSIS — L8994 Pressure ulcer of unspecified site, stage 4: Secondary | ICD-10-CM | POA: Diagnosis not present

## 2013-10-21 DIAGNOSIS — M79609 Pain in unspecified limb: Secondary | ICD-10-CM

## 2013-10-21 DIAGNOSIS — S81809A Unspecified open wound, unspecified lower leg, initial encounter: Secondary | ICD-10-CM

## 2013-10-21 NOTE — Progress Notes (Signed)
VASCULAR LAB PRELIMINARY  ARTERIAL  ABI completed:    RIGHT    LEFT    PRESSURE WAVEFORM  PRESSURE WAVEFORM  BRACHIAL 140 triphasic BRACHIAL 136 triphasic  DP 132 monophasic DP 105 monophasic  AT   AT    PT 115 Dampened monophasic PT 93 Dampened monophasic  PER   PER    GREAT TOE  adequate GREAT TOE  inadequate    RIGHT LEFT  ABI 0.94 0.75     Smiley HousemanSandra E Ronnie Doo, RVT 10/21/2013, 1:19 PM

## 2013-10-28 DIAGNOSIS — L89899 Pressure ulcer of other site, unspecified stage: Secondary | ICD-10-CM | POA: Diagnosis not present

## 2013-11-04 DIAGNOSIS — L89899 Pressure ulcer of other site, unspecified stage: Secondary | ICD-10-CM | POA: Diagnosis not present

## 2013-11-11 DIAGNOSIS — L89899 Pressure ulcer of other site, unspecified stage: Secondary | ICD-10-CM | POA: Diagnosis not present

## 2013-11-18 ENCOUNTER — Encounter (HOSPITAL_BASED_OUTPATIENT_CLINIC_OR_DEPARTMENT_OTHER): Payer: Medicare Other | Attending: Internal Medicine

## 2013-11-18 DIAGNOSIS — L8994 Pressure ulcer of unspecified site, stage 4: Secondary | ICD-10-CM | POA: Insufficient documentation

## 2013-11-18 DIAGNOSIS — L89899 Pressure ulcer of other site, unspecified stage: Secondary | ICD-10-CM | POA: Insufficient documentation

## 2013-12-16 ENCOUNTER — Encounter (HOSPITAL_BASED_OUTPATIENT_CLINIC_OR_DEPARTMENT_OTHER): Payer: Medicare Other | Attending: Internal Medicine

## 2013-12-16 DIAGNOSIS — L89899 Pressure ulcer of other site, unspecified stage: Secondary | ICD-10-CM | POA: Insufficient documentation

## 2013-12-16 DIAGNOSIS — L84 Corns and callosities: Secondary | ICD-10-CM | POA: Diagnosis not present

## 2013-12-16 DIAGNOSIS — L8994 Pressure ulcer of unspecified site, stage 4: Secondary | ICD-10-CM | POA: Diagnosis not present

## 2013-12-23 DIAGNOSIS — L89899 Pressure ulcer of other site, unspecified stage: Secondary | ICD-10-CM | POA: Diagnosis not present

## 2013-12-23 DIAGNOSIS — L8994 Pressure ulcer of unspecified site, stage 4: Secondary | ICD-10-CM | POA: Diagnosis not present

## 2013-12-23 DIAGNOSIS — L84 Corns and callosities: Secondary | ICD-10-CM | POA: Diagnosis not present

## 2013-12-30 DIAGNOSIS — L89899 Pressure ulcer of other site, unspecified stage: Secondary | ICD-10-CM | POA: Diagnosis not present

## 2013-12-30 DIAGNOSIS — L8994 Pressure ulcer of unspecified site, stage 4: Secondary | ICD-10-CM | POA: Diagnosis not present

## 2013-12-30 DIAGNOSIS — L84 Corns and callosities: Secondary | ICD-10-CM | POA: Diagnosis not present

## 2014-01-06 DIAGNOSIS — L8994 Pressure ulcer of unspecified site, stage 4: Secondary | ICD-10-CM | POA: Diagnosis not present

## 2014-01-06 DIAGNOSIS — L84 Corns and callosities: Secondary | ICD-10-CM | POA: Diagnosis not present

## 2014-01-06 DIAGNOSIS — L89899 Pressure ulcer of other site, unspecified stage: Secondary | ICD-10-CM | POA: Diagnosis not present

## 2014-01-20 ENCOUNTER — Encounter (HOSPITAL_BASED_OUTPATIENT_CLINIC_OR_DEPARTMENT_OTHER): Payer: Medicare Other | Attending: Internal Medicine

## 2014-01-20 DIAGNOSIS — L8994 Pressure ulcer of unspecified site, stage 4: Secondary | ICD-10-CM | POA: Insufficient documentation

## 2014-01-20 DIAGNOSIS — L89899 Pressure ulcer of other site, unspecified stage: Secondary | ICD-10-CM | POA: Insufficient documentation

## 2014-01-27 DIAGNOSIS — L8994 Pressure ulcer of unspecified site, stage 4: Secondary | ICD-10-CM | POA: Diagnosis not present

## 2014-01-27 DIAGNOSIS — L89899 Pressure ulcer of other site, unspecified stage: Secondary | ICD-10-CM | POA: Diagnosis not present

## 2014-02-03 DIAGNOSIS — L89899 Pressure ulcer of other site, unspecified stage: Secondary | ICD-10-CM | POA: Diagnosis not present

## 2014-02-03 DIAGNOSIS — L8994 Pressure ulcer of unspecified site, stage 4: Secondary | ICD-10-CM | POA: Diagnosis not present

## 2014-09-16 DEATH — deceased

## 2014-10-07 NOTE — Discharge Summary (Signed)
PATIENT NAME:  [name], Bonnie M MR#:  [id number] DATE OF BIRTH:  17-Mar-1935  DATE OF ADMISSION:  03/29/2013 DATE OF DISCHARGE:  04/07/2013  Please refer to interim discharge summary dictated by Dr. Winona LegatoVaickute on 10/21 for details.   FINAL DISCHARGE DIAGNOSES: 1. Psychosis with bipolar disorder.  2. Hypokalemia.  3. Pyuria with no urinary tract infection.  4. Chronic anemia.  5. Dehydration, likely due to poor p.o. intake.   SECONDARY DIAGNOSIS: None.   CONSULTATIONS: As dictated by Dr. Winona LegatoVaickute in the  interim discharge summary.   PROCEDURES AND RADIOLOGY: As dictated by Dr. Winona LegatoVaickute in the interim discharge summary. No new radiology procedures were obtained subsequent to that.   HISTORY AND SHORT HOSPITAL COURSE: The patient is a 80 year old female with no significant medical problems, who was admitted for metabolic encephalopathy thought to be secondary to electrolyte disturbances and UTI. Please see Dr. Hilbert OdorSainani's discharge history and physical for further details. The patient was also only found to have pyuria and no UTI based on urine culture and sensitivity. Please see dictated interim discharge summary on 10/21 for detailed course of admission to 10/21. Subsequently, the patient was [facility]ry unit and she was able to get a bed yesterday on 10/22 and was discharged in stable condition.   PHYSICAL EXAMINATION: VITAL SIGNS: On the date of discharge, her vital signs are as follows: Temperature 98, heart rate 76 per minute, respirations 18 per minute, blood pressure 117/70, saturating 96% on room air.   PERTINENT PHYSICAL EXAMINATION ON THE DATE OF DISCHARGE:   CARDIOVASCULAR: S1, S2 normal. No murmurs, rubs or gallop.  LUNGS: Clear to auscultation bilaterally. No wheezing, rales, rhonchi or crepitation.  ABDOMEN: Soft, benign.  NEUROLOGIC: Nonfocal examination. The patient was difficult to get a full neurological exam and remained at her baseline.    All other physical examination remained at baseline.   DISCHARGE MEDICATIONS: None.   DISCHARGE DIET: Regular.  DISCHARGE ACTIVITY: As tolerated.   DISCHARGE INSTRUCTIONS AND FOLLOWUP: The patient was instructed to followup with her primary care physician in 1 to 2 weeks and psychiatry at [facility] in 1 to 2 days.   Total time discharging this patient was 45 minutes.      ____________________________ Ellamae SiaVipul S. Sherryll BurgerShah, MD vss:sg D: 04/08/2013 07:20:32 ET T: 04/08/2013 08:05:07 ET JOB#: [id number]  cc: Christine Morton S. Sherryll BurgerShah, MD, <Dictator> Audery AmelJohn T. Clapacs, MD [facility]   Ellamae SiaVIPUL S [facility] MD ELECTRONICALLY SIGNED 04/11/2013 23:31

## 2014-10-07 NOTE — Consult Note (Signed)
Psychiatry: Patient seen for follow up. She continues to be very hyper-religious and delusional and unable to engage in even basic conversation about her own care. Nnot eating and refusing care. PAtient conitinues to need inpatient psychiatric treatment. Referral to [facility] underway. Inpatient IVC renewed and forced med protocol renewed. Will follow.  Electronic Signatures: Audery Amellapacs, John T (MD)  (Signed on 20-Oct-14 17:30)  Authored  Last Updated: 20-Oct-14 17:30 by Audery Amellapacs, John T (MD)

## 2014-10-07 NOTE — Consult Note (Signed)
Brief Consult Note: Diagnosis: dementia with psychotic symptoms. Multifactorial vascular and Alzheimers.   Patient was seen by consultant.   Consult note dictated.   Orders entered.   Comments: Psychiatry: Patient seen. 80 year old woman with worsening psychosis. Hx from chart and pt. I left a message for son to call me. PAtient is paranoid and confused and flat. Denies any SI. Would not cooperate with full MMSE but clearly memory impaired. Diff dx inc delirium vs dementia with psychosis and possibly psychotic depression tho8ugh without specific mood symptoms. Will order standing olanzapine 5mg  qhs for psychosis and follow. Based on current exam patient not capable of making rational decisions about living situation.  Electronic Signatures: Trishelle Devora, Jackquline DenmarkJohn T (MD)  (Signed 14-Oct-14 17:39)  Authored: Brief Consult Note   Last Updated: 14-Oct-14 17:39 by Audery Amellapacs, Korin Hartwell T (MD)

## 2014-10-07 NOTE — Consult Note (Signed)
Psychiatry: Patient seen for follow up. Today she is even more obviously psychotic. She is aware of me being there and responds to me but refuses to give any history or even answer direct questions, instead talking about religious visions and making paranoid statements. Refuses to cooperate with any medical treatment or even to try to sit up. No eating well and refuses meds. Makes odd, stereotyped gestures.are more psychotic than delirious. likely psychosis due to dementia although could have psychotic depression or other psychotic condition. Due to refusal to sit up, stand or eat is not appropriate to our inpatient unit at this time. Patient is under IVC and needs referral to inpatient geropsych unit. Case discussed with unit care coordinater. I have put in order for im zyprexa in place of oral. Please facilitate geropsych unit at earliest opportunity if medically stable. Will follow.  Electronic Signatures: Clapacs, Jackquline DenmarkJohn T (MD)  (Signed on 16-Oct-14 16:04)  Authored  Last Updated: 16-Oct-14 16:04 by Audery Amellapacs, John T (MD)

## 2014-10-07 NOTE — H&P (Signed)
PATIENT NAME:  [name], [name] MR#:  [id number] DATE OF BIRTH:  1934-07-29  DATE OF ADMISSION:  03/28/2013  PRIMARY CARE PHYSICIAN: Does not have one.   CHIEF COMPLAINT: Altered mental status and agitation.   HISTORY OF PRESENT ILLNESS: This is a 80 year old female who presented to the hospital yesterday due to altered mental status and agitation. The patient has received increasing numbers of antipsychotics and sedatives meds; therefore, cannot give a good history. Most of the history is obtained over the phone from the son. As per the son, health has been declining over the past month where she has been eating less and less and also talking about things that do not make sense. As per the son, the patient has been talking about God and refuses to see a physician. She is not taking currently any medications and has not seen a physician in the past 40 years. The patient also has been having some hallucinations and delusions at home and therefore, was brought to the ER yesterday. The patient became agitated and combative and therefore was given some Haldol and Geodon and some Ativan and is currently fairly lethargic. The patient was noted to be slightly hypokalemic and also noted to have a urinary tract infection. Hospitalist services were contacted for further treatment and evaluation.   REVIEW OF SYSTEMS: Otherwise unobtainable given the patient's mental status.   PAST MEDICAL HISTORY: The patient has no significant past medical history.   ALLERGIES: No known drug allergies.   SOCIAL HISTORY: No smoking. No alcohol abuse. No illicit drug abuse. Lives by herself.   FAMILY HISTORY: Both mother and father are deceased. Diabetes does run in the family.   CURRENT MEDICATIONS: Current on no medications.   PHYSICAL EXAMINATION: VITAL SIGNS: Are noted to be: Temperature the patient is afebrile, pulse 54, respirations 19 blood pressure 133/74, sats 97% on room air.   GENERAL: She is a  lethargic-appearing female in bed in no apparent distress.  HEAD, EYES, EARS, NOSE, THROAT EXAM: The patient is atraumatic, normocephalic. Extraocular muscles are intact. Pupils equal and reactive to light. Sclerae anicteric. No conjunctival injection. No pharyngeal erythema.  NECK: Supple. There is no jugular venous distention. No bruits, no lymphadenopathy or thyromegaly.  HEART: Regular rate and rhythm. No murmurs. No rubs. No clicks.  LUNGS: Clear to auscultation bilaterally. No rales, no rhonchi, no wheezes.  ABDOMEN: Soft, flat, nontender, nondistended. Has good bowel sounds. No hepatosplenomegaly appreciated.  EXTREMITIES: No evidence of any cyanosis, clubbing, or peripheral edema. Has +2 pedal and radial pulses bilaterally.  NEUROLOGIC: The patient is alert, awake, and oriented x 1, globally weak. Difficult to do a full neurological exam. Moves all extremities spontaneously.  SKIN: Moist and warm with no rashes.  LYMPHATIC: There is no cervical or axillary lymphadenopathy.   LABORATORY DATA: Serum glucose of 90, BUN 8 creatinine 0.8, sodium 147, potassium 2.8, chloride 107, bicarbonate 30, magnesium 1.8. Troponin less than 0.02. TSH 1.45. Urinalysis showed 2+ leukocyte esterase, 42 white cells, trace bacteria. Urine toxicology is negative. White cell count 8.6, hemoglobin 14, hematocrit 41.2, platelet count 254.   The patient did undergo a CT scan of the head, which showed no acute abnormality with chronic small ischemic disease.   ASSESSMENT AND PLAN: This is a 80 year old female with no significant past medical history who presented the hospital due to altered mental status, confusion and agitation.  1.  Altered mental status. The exact etiology of this is unclear presently but suspected to be due  to her underlying dementia with possible underlying psychosis. There is also concern that her mental status change could be related to metabolic encephalopathy from the urinary tract infection and  hypokalemia. I will give her IV fluids with potassium to correct her potassium, also give her IV ceftriaxone for her urinary tract infection and follow her clinically. We will follow every 4 hour neuro checks. Her CT head is negative. Await further psychiatric input to see if she has underlying psychosis. She has been involuntarily committed I would continue with the sitter for now.  Avoid benzodiazepines and give her just some as needed Haldol for agitation.  2.  Hypokalemia. I will replace her potassium accordingly and repeat it in the morning. Her magnesium level is normal.  3.  Urinary tract infection. Continue IV ceftriaxone follow urine culture.  4.  Agitation, likely secondary to dementia. As needed  Haldol for agitation and avoid benzos that would make her delirium, await further psychiatric input. We will also get a social work and case management consult, as the patient needs help with discharge planning and placement.   CODE STATUS:  The patient is a full code.   TIME SPENT ON ADMISSION:  45 minutes.    ____________________________ Rolly Pancake. Cherlynn Kaiser, MD vjs:cc D: 03/29/2013 18:39:00 ET T: 03/29/2013 19:11:55 ET JOB#: [id number]  cc: Rolly Pancake. Cherlynn Kaiser, MD, <Dictator> Houston Siren MD ELECTRONICALLY SIGNED 03/30/2013 11:17

## 2014-10-07 NOTE — Consult Note (Signed)
Psychiatry: Followup note for this patient with psychosis. Patient has continued to refuse medication and is also refusing to eat. On interview today she made poor eye contact but communicated with me a little bit better than yesterday. She remained confused and disorganized. She was not able to answer direct questions well. Continues to be paranoid. Not able to understand her current situation or make reasonable decisions. I note that she was not given her intramuscular olanzapine last night because of refusal presumably. While we are waiting for a geriatric psychiatry placement I think that it is important for her to get some antipsychotic medicine. I asked the nursing staff about procedure. I have put in a note asking that she be given the IM shot even if she verbally refuses it. This is justified because without antipsychotic medication she is not likely to improve and may continue to deteriorate mentally and physically. If specific paperwork needs to be completed I would be happy to do it or please call the psychiatry consultant over the weekend.  Electronic Signatures: Clapacs, Jackquline DenmarkJohn T (MD)  (Signed on 17-Oct-14 16:57)  Authored  Last Updated: 17-Oct-14 16:57 by Audery Amellapacs, John T (MD)

## 2014-10-07 NOTE — Consult Note (Signed)
Brief Consult Note: Diagnosis: dementia with psychotic symptoms. Multifactorial vascular and Alzheimers.   Patient was seen by consultant.   Consult note dictated.   Orders entered.   Comments: Psychiatry: I saw the patient birefly. She still refuses food/medication. She is floridly psychotic. She needs forced medications. For detail see Dr. Toni Amendlapacs' full consult.   Patient seen. 80 year old woman with worsening psychosis. Hx from chart and pt. I left a message for son to call me. PAtient is paranoid and confused and flat. Denies any SI. Would not cooperate with full MMSE but clearly memory impaired. Diff dx inc delirium vs dementia with psychosis and possibly psychotic depression tho8ugh without specific mood symptoms. Will order standing olanzapine 5mg  qhs for psychosis and follow. Based on current exam patient not capable of making rational decisions about living situation.  PLAN: 1. Emergency and nonemergency forced medication orderes are placed on the chart to administer Zyprexa im injections. Dr. Guss Bundehalla will kindly provide the second signature.  Electronic Signatures: [name], [name] (MD)  (Signed 18-Oct-14 17:08)  Authored: Brief Consult Note   Last Updated: 18-Oct-14 17:08 by [name], [name] (MD)

## 2014-12-23 ENCOUNTER — Observation Stay (HOSPITAL_COMMUNITY): Payer: Medicare Other

## 2014-12-23 ENCOUNTER — Inpatient Hospital Stay (HOSPITAL_COMMUNITY)
Admission: EM | Admit: 2014-12-23 | Discharge: 2014-12-29 | DRG: 070 | Disposition: A | Discharge status: Skilled Nursing Facility | Payer: Medicare Other | Attending: Internal Medicine | Admitting: Internal Medicine

## 2014-12-23 ENCOUNTER — Emergency Department (HOSPITAL_COMMUNITY): Payer: Medicare Other

## 2014-12-23 ENCOUNTER — Encounter (HOSPITAL_COMMUNITY): Payer: Self-pay | Admitting: Emergency Medicine

## 2014-12-23 DIAGNOSIS — F0390 Unspecified dementia without behavioral disturbance: Secondary | ICD-10-CM | POA: Diagnosis not present

## 2014-12-23 DIAGNOSIS — E876 Hypokalemia: Secondary | ICD-10-CM | POA: Diagnosis present

## 2014-12-23 DIAGNOSIS — F209 Schizophrenia, unspecified: Secondary | ICD-10-CM | POA: Diagnosis present

## 2014-12-23 DIAGNOSIS — Z515 Encounter for palliative care: Secondary | ICD-10-CM | POA: Insufficient documentation

## 2014-12-23 DIAGNOSIS — R4182 Altered mental status, unspecified: Secondary | ICD-10-CM

## 2014-12-23 DIAGNOSIS — G9340 Encephalopathy, unspecified: Secondary | ICD-10-CM | POA: Diagnosis not present

## 2014-12-23 DIAGNOSIS — F0280 Dementia in other diseases classified elsewhere without behavioral disturbance: Secondary | ICD-10-CM | POA: Diagnosis present

## 2014-12-23 DIAGNOSIS — L8990 Pressure ulcer of unspecified site, unspecified stage: Secondary | ICD-10-CM | POA: Insufficient documentation

## 2014-12-23 DIAGNOSIS — L89313 Pressure ulcer of right buttock, stage 3: Secondary | ICD-10-CM | POA: Diagnosis present

## 2014-12-23 DIAGNOSIS — G309 Alzheimer's disease, unspecified: Secondary | ICD-10-CM | POA: Diagnosis present

## 2014-12-23 DIAGNOSIS — B353 Tinea pedis: Secondary | ICD-10-CM | POA: Diagnosis present

## 2014-12-23 DIAGNOSIS — Z79899 Other long term (current) drug therapy: Secondary | ICD-10-CM

## 2014-12-23 DIAGNOSIS — R627 Adult failure to thrive: Secondary | ICD-10-CM | POA: Diagnosis present

## 2014-12-23 DIAGNOSIS — R634 Abnormal weight loss: Secondary | ICD-10-CM | POA: Diagnosis present

## 2014-12-23 LAB — COMPREHENSIVE METABOLIC PANEL
ALT: 14 U/L (ref 14–54)
AST: 47 U/L — AB (ref 15–41)
Albumin: 3.6 g/dL (ref 3.5–5.0)
Alkaline Phosphatase: 80 U/L (ref 38–126)
Anion gap: 16 — ABNORMAL HIGH (ref 5–15)
BUN: 18 mg/dL (ref 6–20)
CALCIUM: 9.5 mg/dL (ref 8.9–10.3)
CO2: 23 mmol/L (ref 22–32)
Chloride: 103 mmol/L (ref 101–111)
Creatinine, Ser: 0.9 mg/dL (ref 0.44–1.00)
GFR, EST NON AFRICAN AMERICAN: 60 mL/min — AB (ref >60)
GLUCOSE: 105 mg/dL — AB (ref 65–99)
Potassium: 4 mmol/L (ref 3.5–5.1)
Sodium: 142 mmol/L (ref 135–145)
Total Bilirubin: 1.9 mg/dL — ABNORMAL HIGH (ref 0.3–1.2)
Total Protein: 7.7 g/dL (ref 6.5–8.1)

## 2014-12-23 LAB — URINE MICROSCOPIC-ADD ON

## 2014-12-23 LAB — URINALYSIS, ROUTINE W REFLEX MICROSCOPIC
GLUCOSE, UA: NEGATIVE mg/dL
HGB URINE DIPSTICK: NEGATIVE
KETONES UR: 40 mg/dL — AB
NITRITE: NEGATIVE
PH: 6 (ref 5.0–8.0)
PROTEIN: NEGATIVE mg/dL
SPECIFIC GRAVITY, URINE: 1.02 (ref 1.005–1.030)
Urobilinogen, UA: 1 mg/dL (ref 0.0–1.0)

## 2014-12-23 LAB — RAPID URINE DRUG SCREEN, HOSP PERFORMED
Amphetamines: NOT DETECTED
Barbiturates: NOT DETECTED
Benzodiazepines: NOT DETECTED
Cocaine: NOT DETECTED
OPIATES: NOT DETECTED
Tetrahydrocannabinol: NOT DETECTED

## 2014-12-23 LAB — CBC WITH DIFFERENTIAL/PLATELET
BASOS ABS: 0 10*3/uL (ref 0.0–0.1)
Basophils Relative: 0 % (ref 0–1)
Eosinophils Absolute: 0 10*3/uL (ref 0.0–0.7)
Eosinophils Relative: 0 % (ref 0–5)
HCT: 43.1 % (ref 36.0–46.0)
HEMOGLOBIN: 14.1 g/dL (ref 12.0–15.0)
LYMPHS ABS: 1.8 10*3/uL (ref 0.7–4.0)
LYMPHS PCT: 17 % (ref 12–46)
MCH: 30.7 pg (ref 26.0–34.0)
MCHC: 32.7 g/dL (ref 30.0–36.0)
MCV: 93.9 fL (ref 78.0–100.0)
Monocytes Absolute: 0.7 10*3/uL (ref 0.1–1.0)
Monocytes Relative: 6 % (ref 3–12)
NEUTROS ABS: 8.2 10*3/uL — AB (ref 1.7–7.7)
Neutrophils Relative %: 77 % (ref 43–77)
PLATELETS: 264 10*3/uL (ref 150–400)
RBC: 4.59 MIL/uL (ref 3.87–5.11)
RDW: 15.6 % — ABNORMAL HIGH (ref 11.5–15.5)
WBC: 10.7 10*3/uL — AB (ref 4.0–10.5)

## 2014-12-23 LAB — CBG MONITORING, ED: Glucose-Capillary: 88 mg/dL (ref 65–99)

## 2014-12-23 LAB — ETHANOL

## 2014-12-23 LAB — I-STAT TROPONIN, ED: Troponin i, poc: 0.02 ng/mL (ref 0.00–0.08)

## 2014-12-23 MED ORDER — SODIUM CHLORIDE 0.9 % IV SOLN
INTRAVENOUS | Status: AC
  Administered 2014-12-23: 20:00:00 via INTRAVENOUS

## 2014-12-23 MED ORDER — OLOPATADINE HCL 0.1 % OP SOLN
1.0000 [drp] | Freq: Two times a day (BID) | OPHTHALMIC | Status: DC
  Administered 2014-12-23 – 2014-12-28 (×11): 1 [drp] via OPHTHALMIC
  Filled 2014-12-23: qty 5

## 2014-12-23 MED ORDER — ZIPRASIDONE HCL 80 MG PO CAPS
80.0000 mg | ORAL_CAPSULE | Freq: Two times a day (BID) | ORAL | Status: DC
  Administered 2014-12-24 – 2014-12-29 (×10): 80 mg via ORAL
  Filled 2014-12-23 (×13): qty 1

## 2014-12-23 MED ORDER — BENZOCAINE 10 % MT GEL
1.0000 "application " | Freq: Four times a day (QID) | OROMUCOSAL | Status: DC | PRN
  Filled 2014-12-23: qty 9.4

## 2014-12-23 MED ORDER — MIRTAZAPINE 7.5 MG PO TABS
7.5000 mg | ORAL_TABLET | Freq: Every day | ORAL | Status: DC
  Administered 2014-12-24: 7.5 mg via ORAL
  Filled 2014-12-23 (×3): qty 1

## 2014-12-23 MED ORDER — VENLAFAXINE HCL ER 75 MG PO CP24
75.0000 mg | ORAL_CAPSULE | Freq: Every day | ORAL | Status: DC
  Administered 2014-12-24 – 2014-12-29 (×5): 75 mg via ORAL
  Filled 2014-12-23 (×7): qty 1

## 2014-12-23 MED ORDER — SODIUM CHLORIDE 0.9 % IV SOLN
INTRAVENOUS | Status: AC
  Administered 2014-12-23: 16:00:00 via INTRAVENOUS

## 2014-12-23 MED ORDER — ACETAMINOPHEN 500 MG PO TABS: 500.0000 mg | ORAL_TABLET | ORAL | Status: DC | PRN

## 2014-12-23 MED ORDER — SODIUM CHLORIDE 0.9 % IV BOLUS (SEPSIS)
500.0000 mL | Freq: Once | INTRAVENOUS | Status: AC
  Administered 2014-12-23: 500 mL via INTRAVENOUS

## 2014-12-23 MED ORDER — ENOXAPARIN SODIUM 40 MG/0.4ML ~~LOC~~ SOLN
40.0000 mg | SUBCUTANEOUS | Status: DC
  Administered 2014-12-23 – 2014-12-28 (×6): 40 mg via SUBCUTANEOUS
  Filled 2014-12-23 (×7): qty 0.4

## 2014-12-23 MED ORDER — LORAZEPAM 0.5 MG PO TABS
0.2500 mg | ORAL_TABLET | Freq: Two times a day (BID) | ORAL | Status: DC
  Administered 2014-12-24: 0.25 mg via ORAL
  Filled 2014-12-23: qty 1

## 2014-12-23 MED ORDER — ALUM & MAG HYDROXIDE-SIMETH 200-200-20 MG/5ML PO SUSP: 30.0000 mL | Freq: Four times a day (QID) | ORAL | Status: DC | PRN

## 2014-12-23 MED ORDER — SODIUM CHLORIDE 0.9 % IJ SOLN
3.0000 mL | Freq: Two times a day (BID) | INTRAMUSCULAR | Status: DC
  Administered 2014-12-23 – 2014-12-28 (×7): 3 mL via INTRAVENOUS

## 2014-12-23 MED ORDER — DIVALPROEX SODIUM ER 500 MG PO TB24
500.0000 mg | ORAL_TABLET | Freq: Every day | ORAL | Status: DC
  Filled 2014-12-23: qty 1

## 2014-12-23 MED ORDER — SALINE SPRAY 0.65 % NA SOLN
2.0000 | Freq: Three times a day (TID) | NASAL | Status: DC | PRN
  Filled 2014-12-23: qty 44

## 2014-12-23 MED ORDER — POLYVINYL ALCOHOL 1.4 % OP SOLN
1.0000 [drp] | Freq: Two times a day (BID) | OPHTHALMIC | Status: DC
  Administered 2014-12-23 – 2014-12-28 (×11): 1 [drp] via OPHTHALMIC
  Filled 2014-12-23: qty 15

## 2014-12-23 NOTE — Consult Note (Signed)
Referring Physician: Littie DeedsGentry    Chief Complaint: AMS as code stroke  HPI:                                                                                                                                         [name] is an 80 y.o. female who resides at a SNF and had been noted to be acting different and drowsy for the past three days. No one from the SNF had seen patient this am but at 12 PM she was noted to be more lethargic than usual. EMS was called and [facility].  History given to ED was there was a sudden change in arousal.  MD called a code stroke and [facility] head showed no acute stroke or bleed. On arrival patient was lethargic and appeared encephalopathic.  She only followed simple commands and mumbling.  Exam was difficult as she was resisting exam.   Date last known well: Date: 12/20/2014 Time last known well: Unable to determine tPA Given: No: out of window     Past Medical History  Diagnosis Date  . Alzheimer's dementia   . Schizophrenia   . Tinea pedis   . Bilateral lower extremity edema   . Hypokalemia     History reviewed. No pertinent past surgical history.  Family History  Problem Relation Age of Onset  . Hypertension Mother   . Hypertension Father    Social History:  reports that she has never smoked. She does not have any smokeless tobacco history on file. She reports that she does not drink alcohol or use illicit drugs.  Allergies: No Known Allergies  Medications:                                                                                                                           No current facility-administered medications for this encounter.   Current Outpatient Prescriptions  Medication Sig Dispense Refill  . benzocaine (ORAJEL) 10 % mucosal gel Use as directed 1 application in the mouth or throat every 6 (six) hours as needed for mouth pain.    . cetirizine (ZYRTEC) 10 MG tablet Take 10 mg by  mouth at bedtime.    . divalproex (DEPAKOTE ER) 500 MG 24 hr tablet Take 500 mg by mouth at bedtime.    .Marland Kitchen  hydrocortisone cream 0.5 % Apply 1 application topically 2 (two) times daily as needed for itching.    Marland Kitchen LORazepam (ATIVAN) 0.5 MG tablet Take 0.25 mg by mouth 2 (two) times daily.    . Melatonin 3 MG TABS Take 1 tablet by mouth at bedtime.    . mirtazapine (REMERON) 7.5 MG tablet Take 7.5 mg by mouth at bedtime.    . Olopatadine HCl 0.2 % SOLN Place 1 drop into both eyes daily.    . polyvinyl alcohol (LIQUIFILM TEARS) 1.4 % ophthalmic solution Place 1 drop into both eyes 2 (two) times daily.    . sodium chloride (OCEAN) 0.65 % SOLN nasal spray Place 2 sprays into both nostrils every 8 (eight) hours as needed for congestion.    Marland Kitchen venlafaxine XR (EFFEXOR-XR) 75 MG 24 hr capsule Take 75 mg by mouth daily with breakfast.    . ziprasidone (GEODON) 80 MG capsule Take 1 capsule (80 mg total) by mouth 2 (two) times daily with a meal. 60 capsule 0  . acetaminophen (TYLENOL) 500 MG tablet Take 500 mg by mouth every 4 (four) hours as needed (fever/pain).    Marland Kitchen alum & mag hydroxide-simeth (MAALOX/MYLANTA) 200-200-20 MG/5ML suspension Take 30 mLs by mouth every 6 (six) hours as needed for indigestion or heartburn.    Marland Kitchen guaiFENesin (ROBITUSSIN) 100 MG/5ML SOLN Take 10 mLs by mouth every 6 (six) hours as needed for cough or to loosen phlegm (not to exceed 4 doses).    Marland Kitchen HYDROcodone-acetaminophen (NORCO/VICODIN) 5-325 MG per tablet Take 1-2 tablets by mouth every 4 (four) hours as needed for moderate pain. (Patient not taking: Reported on 12/23/2014) 30 tablet 0  . loperamide (IMODIUM A-D) 2 MG tablet Take 2 mg by mouth 4 (four) times daily as needed for diarrhea or loose stools.    . magnesium hydroxide (MILK OF MAGNESIA) 400 MG/5ML suspension Take 30 mLs by mouth at bedtime as needed for mild constipation.    . ondansetron (ZOFRAN) 4 MG tablet Take 1 tablet (4 mg total) by mouth every 6 (six) hours as needed  for nausea. (Patient not taking: Reported on 12/23/2014) 20 tablet 0  . zolpidem (AMBIEN) 5 MG tablet Take 1 tablet (5 mg total) by mouth at bedtime as needed for sleep (insomnia). (Patient not taking: Reported on 12/23/2014) 30 tablet 0     ROS:                                                                                                                                       History obtained from unobtainable from patient due to lack of cooperation  Neurologic Examination:  Blood pressure 111/67, pulse 65, temperature 97.4 F (36.3 C), temperature source Rectal, resp. rate 16, SpO2 96 %.  HEENT-  Normocephalic, no lesions, without obvious abnormality.  Normal external eye and conjunctiva.  Normal TM's bilaterally.  Normal auditory canals and external ears. Normal external nose, mucus membranes and septum.  Normal pharynx. Cardiovascular- S1, S2 normal, pulses palpable throughout   Lungs- chest clear, no wheezing, rales, normal symmetric air entry Abdomen- normal findings: bowel sounds normal Extremities- no edema Lymph-no adenopathy palpable Musculoskeletal-no joint tenderness, deformity or swelling Skin-dry  Neurological Examination Mental Status: Drowsy, mumbling, will not follow commands.  Cranial Nerves: II: Discs flat bilaterally; holds eyes tightly shut  But blinks to threat, pupils equal, round, reactive to light and accommodation III,IV, VI: ptosis not present, extra-ocular motions intact bilaterally V,VII: smile symmetric, facial light touch sensation normal bilaterally VIII: hearing normal bilaterally IX,X: uvula rises symmetrically XI: bilateral shoulder shrug XII: midline tongue extension Motor: Moves all extremities antigravity Sensory: sensory intact throught Deep Tendon Reflexes: 1+ and symmetric throughout Plantars: Mute bilatereally Cerebellar: Would not take part  in exam Gait: not tested       Lab Results: Basic Metabolic Panel:  Recent Labs Lab 12/23/14 1450  NA 142  K 4.0  CL 103  CO2 23  GLUCOSE 105*  BUN 18  CREATININE 0.90  CALCIUM 9.5    Liver Function Tests:  Recent Labs Lab 12/23/14 1450  AST 47*  ALT 14  ALKPHOS 80  BILITOT 1.9*  PROT 7.7  ALBUMIN 3.6   No results for input(s): LIPASE, AMYLASE in the last 168 hours. No results for input(s): AMMONIA in the last 168 hours.  CBC:  Recent Labs Lab 12/23/14 1450  WBC 10.7*  NEUTROABS 8.2*  HGB 14.1  HCT 43.1  MCV 93.9  PLT 264    Cardiac Enzymes: No results for input(s): CKTOTAL, CKMB, CKMBINDEX, TROPONINI in the last 168 hours.  Lipid Panel: No results for input(s): CHOL, TRIG, HDL, CHOLHDL, VLDL, LDLCALC in the last 168 hours.  CBG: No results for input(s): GLUCAP in the last 168 hours.  Microbiology: Results for orders placed or performed in visit on 03/29/13  Urine culture     Status: None   Collection Time: 03/30/13  6:29 AM  Result Value Ref Range Status   Micro Text Report   Final       SOURCE: CLEAN CATCH    COMMENT                   MIXED BACTERIAL ORGANISMS   COMMENT                   RESULTS SUGGESTIVE OF CONTAMINATION   ANTIBIOTIC                                                        Coagulation Studies: No results for input(s): LABPROT, INR in the last 72 hours.  Imaging: Dg Chest 2 View  12/23/2014   CLINICAL DATA:  Altered mental status.  EXAM: CHEST  2 VIEW  COMPARISON:  None.  FINDINGS: Lungs are clear. Heart size is normal. The aorta is tortuous. No pneumothorax or pleural effusion.  IMPRESSION: No acute disease.   Electronically Signed   By: Drusilla Kanner M.D.   On: 12/23/2014 15:02  Ct Head Wo Contrast  12/23/2014   CLINICAL DATA:  Altered mental status.  EXAM: CT HEAD WITHOUT CONTRAST  TECHNIQUE: Contiguous axial images were obtained from the base of the skull through the vertex without intravenous contrast.   COMPARISON:  CT scan of March 28, 2013.  FINDINGS: Bony calvarium appears intact. Left ethmoid sinusitis is noted. Mild diffuse cortical atrophy is noted. Mild chronic ischemic white matter disease is noted. No mass effect or midline shift is noted. Ventricular size is within normal limits. There is no evidence of mass lesion, hemorrhage or acute infarction.  IMPRESSION: Mild diffuse cortical atrophy. Mild chronic ischemic white matter disease. Mild left ethmoid sinusitis. No acute intracranial abnormality seen.   Electronically Signed   By: Lupita Raider, M.D.   On: 12/23/2014 15:11    Felicie Morn PA-C [facility] [phone]  12/23/2014, 3:42 PM   Assessment: 80 y.o. female presenting to hospital after SNF noted three days of increased lethargy. Due to history of acute change code stroke was called.  On exam patient has no focal deficits and is lethargic. Likely UTI or underlying encephalopathy.  Stroke Risk Factors - none   Recommend: 1) MRI brain--if negative no further stroke work up warranted.  2) Evaluate for underlying infection or metabolic derangement, if MRI shows no signs of acute stroke.   I personally participated in this patient's evaluation and management, including clinical examination, as well as formulating the above clinical impression and management recommendations.  Venetia Maxon M.D. [facility] [phone]

## 2014-12-23 NOTE — ED Notes (Signed)
Per EMS: pt from [facility], pt was altered around lunch per staff due to not eating lunch and not responding as usual. Pt has dementia and schizo.

## 2014-12-23 NOTE — ED Notes (Signed)
Bed: WA03 Expected date:  Expected time:  Means of arrival:  Comments: EMS-AMS

## 2014-12-23 NOTE — Code Documentation (Signed)
80[facility] where she was found to have altered mental status at lunch.  Patient has a history of dementia and schizophrenia.  Per EDP documentation patient has been drowsy and less active for the past few days.  [facility] and [facility] at [id number].  Stroke team at the bedside.  Patient to E40.  NIHSS 5, see documentation for details.  Patient very drowsy and exam difficult d/t mental status.  No focal symptoms on exam.  Dr. Roseanne RenoStewart to the bedside.  No acute stroke treatment at this time.  Code stroke canceled.  Bedside handoff with ED RN Brett CanalesSteve.

## 2014-12-23 NOTE — ED Provider Notes (Signed)
CSN: [id number]     Arrival date & time 12/23/14  1356 History   First MD Initiated Contact with Patient 12/23/14 1401     Chief Complaint  Patient presents with  . Altered Mental Status     (Consider location/radiation/quality/duration/timing/severity/associated sxs/prior Treatment) HPI Comments: 80 year old female with history of dementia, schizophrenia, osteomyelitis of foot [facility]. Per report patient had acute change in mental status approximately 12:30 this afternoon. Neurology myself evaluated the patient at the bedside, difficulty obtaining any details from history due to altered mental status and dementia history. No family at bedside. Last dnr reviewed full code however not recent. Further details were obtained from nursing home and she has had gradually worsening mental status for the past 2-3 days however became more significant around noon today. Patient less talkative compared to normal and less responsive. No smoking history.  Patient is a 80 y.o. female presenting with altered mental status. The history is provided by the nursing home, the EMS personnel and medical records.  Altered Mental Status   Past Medical History  Diagnosis Date  . Alzheimer's dementia   . Schizophrenia   . Tinea pedis   . Bilateral lower extremity edema   . Hypokalemia    History reviewed. No pertinent past surgical history. Family History  Problem Relation Age of Onset  . Hypertension Mother   . Hypertension Father    History  Substance Use Topics  . Smoking status: Never Smoker   . Smokeless tobacco: Not on file  . Alcohol Use: No   OB History    No data available     Review of Systems  Unable to perform ROS: Mental status change      Allergies  Review of patient's allergies indicates no known allergies.  Home Medications   Prior to Admission medications   Medication Sig Start Date End Date Taking? Authorizing Provider  benzocaine  (ORAJEL) 10 % mucosal gel Use as directed 1 application in the mouth or throat every 6 (six) hours as needed for mouth pain.   Yes Historical Provider, MD  cetirizine (ZYRTEC) 10 MG tablet Take 10 mg by mouth at bedtime.   Yes Historical Provider, MD  divalproex (DEPAKOTE ER) 500 MG 24 hr tablet Take 500 mg by mouth at bedtime.   Yes Historical Provider, MD  hydrocortisone cream 0.5 % Apply 1 application topically 2 (two) times daily as needed for itching.   Yes Historical Provider, MD  LORazepam (ATIVAN) 0.5 MG tablet Take 0.25 mg by mouth 2 (two) times daily.   Yes Historical Provider, MD  Melatonin 3 MG TABS Take 1 tablet by mouth at bedtime.   Yes Historical Provider, MD  mirtazapine (REMERON) 7.5 MG tablet Take 7.5 mg by mouth at bedtime.   Yes Historical Provider, MD  Olopatadine HCl 0.2 % SOLN Place 1 drop into both eyes daily.   Yes Historical Provider, MD  polyvinyl alcohol (LIQUIFILM TEARS) 1.4 % ophthalmic solution Place 1 drop into both eyes 2 (two) times daily.   Yes Historical Provider, MD  sodium chloride (OCEAN) 0.65 % SOLN nasal spray Place 2 sprays into both nostrils every 8 (eight) hours as needed for congestion.   Yes Historical Provider, MD  venlafaxine XR (EFFEXOR-XR) 75 MG 24 hr capsule Take 75 mg by mouth daily with breakfast.   Yes Historical Provider, MD  ziprasidone (GEODON) 80 MG capsule Take 1 capsule (80 mg total) by mouth 2 (two) times daily with a  meal. 06/22/13  Yes Alison Murray, MD  acetaminophen (TYLENOL) 500 MG tablet Take 500 mg by mouth every 4 (four) hours as needed (fever/pain).    Historical Provider, MD  alum & mag hydroxide-simeth (MAALOX/MYLANTA) 200-200-20 MG/5ML suspension Take 30 mLs by mouth every 6 (six) hours as needed for indigestion or heartburn.    Historical Provider, MD  guaiFENesin (ROBITUSSIN) 100 MG/5ML SOLN Take 10 mLs by mouth every 6 (six) hours as needed for cough or to loosen phlegm (not to exceed 4 doses).    Historical Provider, MD   HYDROcodone-acetaminophen (NORCO/VICODIN) 5-325 MG per tablet Take 1-2 tablets by mouth every 4 (four) hours as needed for moderate pain. Patient not taking: Reported on 12/23/2014 06/22/13   Alison Murray, MD  loperamide (IMODIUM A-D) 2 MG tablet Take 2 mg by mouth 4 (four) times daily as needed for diarrhea or loose stools.    Historical Provider, MD  magnesium hydroxide (MILK OF MAGNESIA) 400 MG/5ML suspension Take 30 mLs by mouth at bedtime as needed for mild constipation.    Historical Provider, MD  ondansetron (ZOFRAN) 4 MG tablet Take 1 tablet (4 mg total) by mouth every 6 (six) hours as needed for nausea. Patient not taking: Reported on 12/23/2014 06/22/13   Alison Murray, MD  zolpidem (AMBIEN) 5 MG tablet Take 1 tablet (5 mg total) by mouth at bedtime as needed for sleep (insomnia). Patient not taking: Reported on 12/23/2014 06/22/13   Alison Murray, MD   BP 111/67 mmHg  Pulse 65  Temp(Src) 97.4 F (36.3 C) (Rectal)  Resp 16  SpO2 96% Physical Exam  Constitutional: She appears well-developed. No distress.  HENT:  Head: Normocephalic and atraumatic.  Dry mucous membranes  Eyes: Pupils are equal, round, and reactive to light. No scleral icterus.  Neck: Neck supple.  Cardiovascular: Normal rate and regular rhythm.   Pulmonary/Chest: Effort normal. No respiratory distress (decr effort).  Abdominal: Soft.  Musculoskeletal: She exhibits no edema.  Neurological: She is alert. GCS eye subscore is 3. GCS verbal subscore is 3. GCS motor subscore is 5.  Patient alert to loud verbal, difficulty discerning any verbal response, patient has general weakness with mild movement of all extremities to pain and assistance. Pupils 2 mm equal bilateral. Difficulty neuro exam due to altered mental status, neck supple  Skin: Skin is warm.  Psychiatric:  altered  Nursing note and vitals reviewed.   ED Course  Procedures (including critical care time) Labs Review Labs Reviewed  CBC WITH  DIFFERENTIAL/PLATELET - Abnormal; Notable for the following:    WBC 10.7 (*)    RDW 15.6 (*)    Neutro Abs 8.2 (*)    All other components within normal limits  COMPREHENSIVE METABOLIC PANEL - Abnormal; Notable for the following:    Glucose, Bld 105 (*)    AST 47 (*)    Total Bilirubin 1.9 (*)    GFR calc non Af Amer 60 (*)    Anion gap 16 (*)    All other components within normal limits  ETHANOL  URINALYSIS, ROUTINE W REFLEX MICROSCOPIC (NOT AT [facility])  URINE RAPID DRUG SCREEN, HOSP PERFORMED  I-STAT TROPOININ, ED  CBG MONITORING, ED    Imaging Review Dg Chest 2 View  12/23/2014   CLINICAL DATA:  Altered mental status.  EXAM: CHEST  2 VIEW  COMPARISON:  None.  FINDINGS: Lungs are clear. Heart size is normal. The aorta is tortuous. No pneumothorax or pleural effusion.  IMPRESSION: No  acute disease.   Electronically Signed   By: Drusilla Kanner M.D.   On: 12/23/2014 15:02   Ct Head Wo Contrast  12/23/2014   CLINICAL DATA:  Altered mental status.  EXAM: CT HEAD WITHOUT CONTRAST  TECHNIQUE: Contiguous axial images were obtained from the base of the skull through the vertex without intravenous contrast.  COMPARISON:  CT scan of March 28, 2013.  FINDINGS: Bony calvarium appears intact. Left ethmoid sinusitis is noted. Mild diffuse cortical atrophy is noted. Mild chronic ischemic white matter disease is noted. No mass effect or midline shift is noted. Ventricular size is within normal limits. There is no evidence of mass lesion, hemorrhage or acute infarction.  IMPRESSION: Mild diffuse cortical atrophy. Mild chronic ischemic white matter disease. Mild left ethmoid sinusitis. No acute intracranial abnormality seen.   Electronically Signed   By: Lupita Raider, M.D.   On: 12/23/2014 15:11     EKG Interpretation None      MDM   Final diagnoses:  Altered mental status, unspecified altered mental status type   Patient presents as code stroke, neurology myself assessed patient in ER the  bedside, code stroke canceled as symptoms/signs have been worsening for 2-3 days. With patient not being her baseline differential discussed with neurology and plan for medicine admission. Urinalysis pending. If no urine infection patient may require MRI and patient, no emergent MRI recorded this time. No family at the bedside.  The patients results and plan were reviewed and discussed.   Any x-rays performed were independently reviewed by myself.   Differential diagnosis were considered with the presenting HPI.  Medications  sodium chloride 0.9 % bolus 500 mL (not administered)    Filed Vitals:   12/23/14 1405 12/23/14 1511 12/23/14 1534  BP: 137/80  111/67  Pulse: 81  65  Temp: 97.6 F (36.4 C) 98.2 F (36.8 C) 97.4 F (36.3 C)  TempSrc: Oral  Rectal  Resp: 10  16  SpO2: 100%  96%    Final diagnoses:  Altered mental status, unspecified altered mental status type    Admission/ observation were discussed with the admitting physician, patient and/or family and they are comfortable with the plan.     Blane Ohara, MD 12/27/14 [phone]

## 2014-12-23 NOTE — ED Provider Notes (Signed)
CSN: [id number]     Arrival date & time 12/23/14  1356 History   First MD Initiated Contact with Patient 12/23/14 1401     Chief Complaint  Patient presents with  . Altered Mental Status     (Consider location/radiation/quality/duration/timing/severity/associated sxs/prior Treatment) Patient is a 80 y.o. female presenting with altered mental status.  Altered Mental Status Presenting symptoms: confusion and lethargy   Severity:  Severe Most recent episode:  Today Episode history:  Continuous Duration:  2 hours Timing:  Constant Progression:  Unchanged Context: dementia and nursing home resident   Associated symptoms: abdominal pain and slurred speech   Associated symptoms: no fever     Past Medical History  Diagnosis Date  . Alzheimer's dementia   . Schizophrenia   . Tinea pedis   . Bilateral lower extremity edema   . Hypokalemia    History reviewed. No pertinent past surgical history. Family History  Problem Relation Age of Onset  . Hypertension Mother   . Hypertension Father    History  Substance Use Topics  . Smoking status: Never Smoker   . Smokeless tobacco: Not on file  . Alcohol Use: No   OB History    No data available     Review of Systems  Unable to perform ROS: Mental status change  Constitutional: Negative for fever.  Gastrointestinal: Positive for abdominal pain.  Psychiatric/Behavioral: Positive for confusion.      Allergies  Review of patient's allergies indicates no known allergies.  Home Medications   Prior to Admission medications   Medication Sig Start Date End Date Taking? Authorizing Provider  benzocaine (ORAJEL) 10 % mucosal gel Use as directed 1 application in the mouth or throat every 6 (six) hours as needed for mouth pain.   Yes Historical Provider, MD  cetirizine (ZYRTEC) 10 MG tablet Take 10 mg by mouth at bedtime.   Yes Historical Provider, MD  divalproex (DEPAKOTE ER) 500 MG 24 hr tablet Take 500 mg by mouth at bedtime.    Yes Historical Provider, MD  hydrocortisone cream 0.5 % Apply 1 application topically 2 (two) times daily as needed for itching.   Yes Historical Provider, MD  LORazepam (ATIVAN) 0.5 MG tablet Take 0.25 mg by mouth 2 (two) times daily.   Yes Historical Provider, MD  Melatonin 3 MG TABS Take 1 tablet by mouth at bedtime.   Yes Historical Provider, MD  mirtazapine (REMERON) 7.5 MG tablet Take 7.5 mg by mouth at bedtime.   Yes Historical Provider, MD  Olopatadine HCl 0.2 % SOLN Place 1 drop into both eyes daily.   Yes Historical Provider, MD  polyvinyl alcohol (LIQUIFILM TEARS) 1.4 % ophthalmic solution Place 1 drop into both eyes 2 (two) times daily.   Yes Historical Provider, MD  sodium chloride (OCEAN) 0.65 % SOLN nasal spray Place 2 sprays into both nostrils every 8 (eight) hours as needed for congestion.   Yes Historical Provider, MD  venlafaxine XR (EFFEXOR-XR) 75 MG 24 hr capsule Take 75 mg by mouth daily with breakfast.   Yes Historical Provider, MD  ziprasidone (GEODON) 80 MG capsule Take 1 capsule (80 mg total) by mouth 2 (two) times daily with a meal. 06/22/13  Yes Alison Murray, MD  acetaminophen (TYLENOL) 500 MG tablet Take 500 mg by mouth every 4 (four) hours as needed (fever/pain).    Historical Provider, MD  alum & mag hydroxide-simeth (MAALOX/MYLANTA) 200-200-20 MG/5ML suspension Take 30 mLs by mouth every 6 (six) hours as needed for  indigestion or heartburn.    Historical Provider, MD  guaiFENesin (ROBITUSSIN) 100 MG/5ML SOLN Take 10 mLs by mouth every 6 (six) hours as needed for cough or to loosen phlegm (not to exceed 4 doses).    Historical Provider, MD  HYDROcodone-acetaminophen (NORCO/VICODIN) 5-325 MG per tablet Take 1-2 tablets by mouth every 4 (four) hours as needed for moderate pain. Patient not taking: Reported on 12/23/2014 06/22/13   Alison Murray, MD  loperamide (IMODIUM A-D) 2 MG tablet Take 2 mg by mouth 4 (four) times daily as needed for diarrhea or loose stools.    Historical  Provider, MD  magnesium hydroxide (MILK OF MAGNESIA) 400 MG/5ML suspension Take 30 mLs by mouth at bedtime as needed for mild constipation.    Historical Provider, MD  ondansetron (ZOFRAN) 4 MG tablet Take 1 tablet (4 mg total) by mouth every 6 (six) hours as needed for nausea. Patient not taking: Reported on 12/23/2014 06/22/13   Alison Murray, MD  zolpidem (AMBIEN) 5 MG tablet Take 1 tablet (5 mg total) by mouth at bedtime as needed for sleep (insomnia). Patient not taking: Reported on 12/23/2014 06/22/13   Alison Murray, MD   BP 148/80 mmHg  Pulse 72  Temp(Src) 97.8 F (36.6 C) (Axillary)  Resp 18  SpO2 98% Physical Exam  Constitutional: She appears well-developed and well-nourished.  HENT:  Head: Normocephalic and atraumatic.  Right Ear: External ear normal.  Left Ear: External ear normal.  Eyes: Conjunctivae and EOM are normal. Pupils are equal, round, and reactive to light.  Neck: Normal range of motion. Neck supple.  Cardiovascular: Normal rate, regular rhythm, normal heart sounds and intact distal pulses.   Pulmonary/Chest: Effort normal and breath sounds normal.  Abdominal: Soft. Bowel sounds are normal. There is no tenderness.  Musculoskeletal: Normal range of motion.  Neurological: She is alert. GCS eye subscore is 3. GCS verbal subscore is 4. GCS motor subscore is 6.  No focal neuro deficits  Skin: Skin is warm and dry.  Vitals reviewed.   ED Course  Procedures (including critical care time) Labs Review Labs Reviewed  CBC WITH DIFFERENTIAL/PLATELET - Abnormal; Notable for the following:    WBC 10.7 (*)    RDW 15.6 (*)    Neutro Abs 8.2 (*)    All other components within normal limits  COMPREHENSIVE METABOLIC PANEL - Abnormal; Notable for the following:    Glucose, Bld 105 (*)    AST 47 (*)    Total Bilirubin 1.9 (*)    GFR calc non Af Amer 60 (*)    Anion gap 16 (*)    All other components within normal limits  URINALYSIS, ROUTINE W REFLEX MICROSCOPIC (NOT AT [facility])  - Abnormal; Notable for the following:    Color, Urine AMBER (*)    APPearance HAZY (*)    Bilirubin Urine MODERATE (*)    Ketones, ur 40 (*)    Leukocytes, UA SMALL (*)    All other components within normal limits  COMPREHENSIVE METABOLIC PANEL - Abnormal; Notable for the following:    Sodium 146 (*)    Potassium 2.5 (*)    Calcium 8.7 (*)    Total Protein 5.9 (*)    Albumin 2.8 (*)    ALT 11 (*)    Total Bilirubin 1.6 (*)    All other components within normal limits  CBC - Abnormal; Notable for the following:    RDW 15.6 (*)    All other components within  normal limits  URINE MICROSCOPIC-ADD ON - Abnormal; Notable for the following:    Squamous Epithelial / LPF FEW (*)    Casts HYALINE CASTS (*)    All other components within normal limits  ETHANOL  URINE RAPID DRUG SCREEN, HOSP PERFORMED  MAGNESIUM  I-STAT TROPOININ, ED  CBG MONITORING, ED    Imaging Review Dg Chest 2 View  12/23/2014   CLINICAL DATA:  Altered mental status.  EXAM: CHEST  2 VIEW  COMPARISON:  None.  FINDINGS: Lungs are clear. Heart size is normal. The aorta is tortuous. No pneumothorax or pleural effusion.  IMPRESSION: No acute disease.   Electronically Signed   By: Drusilla Kannerhomas  Dalessio M.D.   On: 12/23/2014 15:02   Ct Head Wo Contrast  12/23/2014   CLINICAL DATA:  Altered mental status.  EXAM: CT HEAD WITHOUT CONTRAST  TECHNIQUE: Contiguous axial images were obtained from the base of the skull through the vertex without intravenous contrast.  COMPARISON:  CT scan of March 28, 2013.  FINDINGS: Bony calvarium appears intact. Left ethmoid sinusitis is noted. Mild diffuse cortical atrophy is noted. Mild chronic ischemic white matter disease is noted. No mass effect or midline shift is noted. Ventricular size is within normal limits. There is no evidence of mass lesion, hemorrhage or acute infarction.  IMPRESSION: Mild diffuse cortical atrophy. Mild chronic ischemic white matter disease. Mild left ethmoid sinusitis. No  acute intracranial abnormality seen.   Electronically Signed   By: Lupita RaiderJames  Green Jr, M.D.   On: 12/23/2014 15:11   Mr Brain Wo Contrast  12/23/2014   CLINICAL DATA:  Acute encephalopathy  EXAM: MRI HEAD WITHOUT CONTRAST  TECHNIQUE: Multiplanar, multiecho pulse sequences of the brain and surrounding structures were obtained without intravenous contrast.  COMPARISON:  CT head 12/23/2014  FINDINGS: Image quality degraded by mild to moderate motion  Moderate atrophy.  Negative for hydrocephalus  Negative for acute infarct. Mild to moderate chronic microvascular ischemic change in the white matter. Brainstem and cerebellum intact.  Negative for hemorrhage or mass  Paranasal sinuses reveal mild mucosal edema left ethmoid sinus. Normal orbit. Pituitary not enlarged.  IMPRESSION: Image quality degraded by motion  Generalized atrophy with chronic microvascular ischemia. No acute intracranial abnormality.   Electronically Signed   By: Marlan Palauharles  Clark M.D.   On: 12/23/2014 18:44     EKG Interpretation   Date/Time:  Friday December 23 2014 15:31:09 EDT Ventricular Rate:  71 PR Interval:  130 QRS Duration: 92 QT Interval:  427 QTC Calculation: 464 R Axis:   -15 Text Interpretation:  Atrial-paced complexes Borderline left axis  deviation Low voltage, precordial leads Borderline T wave abnormalities  Confirmed by ZAVITZ  MD, JOSHUA ([id number]) on 12/23/2014 4:13:45 PM      MDM   Final diagnoses:  Altered mental status, unspecified altered mental status type    80 y.o. female with pertinent PMH of alzheimer's, schizophrenia presents with altered mental status. History per nursing home staff.  She normally is very high functioning, will normally. For last few days pt has been drowsy, less active, however was more or less at her norm this am.  Today this acutely worsened.  Per nursing home staff pt was at her baseline at 1230, and by 2PM she was acutely altered and confused.  On arrival today vitals and physical exam as  above.  Pt oriented x 1, very drowsy but arousable.  No localizing symptoms.  Code stroke called.  Dr. Roseanne RenoStewart of neurology recommends CT and  transfer [facility].  Transferred in stable condition  I have reviewed all laboratory and imaging studies if ordered as above  1. Altered mental status, unspecified altered mental status type   2. Acute encephalopathy         Mirian Mo, MD 12/24/14 0700

## 2014-12-23 NOTE — H&P (Signed)
History and Physical    [name] ZOX:[id number] DOB: 12/21/1934 DOA: 12/23/2014  Referring physician: Dr. Jodi MourningZavitz PCP: Bonnie ParkerBOWEN,[name]  Specialists: neurology  Chief Complaint: Acute encephalopathy  HPI: Bonnie NipperStella M Hooper is a 80 y.o. female has a past medical history significant for Alzheimer's dementia, schizophrenia, was [facility] as she has been drowsy, weak and less active for the past few days. Code stroke was activated [facility] and patient was [facility] where she was evaluated by neurology. Since her symptoms have been going on for a few days, cold stroke was canceled and it is felt that stroke is less likely as patient has underlying medical conditions that could explain her mental status. In the emergency room, on my evaluation, patient is alert, can answer basic questions, however she is oriented to person only and cannot provide any reliable history. I was able to get in touch with patient's sister [name], to whom it was quite difficult to discuss as she appeared hard of hearing. She was able to tell me that patient was maybe a little bit more lethargic illness far back as a month ago. In the emergency room, patient has a mild leukocytosis of 10.7, she has borderline elevation of her bilirubin. TRH was asked for admission for acute encephalopathy.   Review of Systems:  unable to obtain review of system due to underlying dementia   Past Medical History  Diagnosis Date  . Alzheimer's dementia   . Schizophrenia   . Tinea pedis   . Bilateral lower extremity edema   . Hypokalemia    History reviewed. No pertinent past surgical history. Social History:  reports that she has never smoked. She does not have any smokeless tobacco history on file. She reports that she does not drink alcohol or use illicit drugs.  No Known Allergies  Family History  Problem Relation Age of Onset  .  Hypertension Mother   . Hypertension Father     Prior to Admission medications   Medication Sig Start Date End Date Taking? Authorizing Provider  benzocaine (ORAJEL) 10 % mucosal gel Use as directed 1 application in the mouth or throat every 6 (six) hours as needed for mouth pain.   Yes Historical Provider, Hooper  cetirizine (ZYRTEC) 10 MG tablet Take 10 mg by mouth at bedtime.   Yes Historical Provider, Hooper  divalproex (DEPAKOTE ER) 500 MG 24 hr tablet Take 500 mg by mouth at bedtime.   Yes Historical Provider, Hooper  hydrocortisone cream 0.5 % Apply 1 application topically 2 (two) times daily as needed for itching.   Yes Historical Provider, Hooper  LORazepam (ATIVAN) 0.5 MG tablet Take 0.25 mg by mouth 2 (two) times daily.   Yes Historical Provider, Hooper  Melatonin 3 MG TABS Take 1 tablet by mouth at bedtime.   Yes Historical Provider, Hooper  mirtazapine (REMERON) 7.5 MG tablet Take 7.5 mg by mouth at bedtime.   Yes Historical Provider, Hooper  Olopatadine HCl 0.2 % SOLN Place 1 drop into both eyes daily.   Yes Historical Provider, Hooper  polyvinyl alcohol (LIQUIFILM TEARS) 1.4 % ophthalmic solution Place 1 drop into both eyes 2 (two) times daily.   Yes Historical Provider, Hooper  sodium chloride (OCEAN) 0.65 % SOLN nasal spray Place 2 sprays into both nostrils every 8 (eight) hours as needed for congestion.   Yes Historical Provider, Hooper  venlafaxine XR (EFFEXOR-XR) 75 MG 24 hr capsule  Take 75 mg by mouth daily with breakfast.   Yes Historical Provider, Hooper  ziprasidone (GEODON) 80 MG capsule Take 1 capsule (80 mg total) by mouth 2 (two) times daily with a meal. 06/22/13  Yes Alison Murray, Hooper  acetaminophen (TYLENOL) 500 MG tablet Take 500 mg by mouth every 4 (four) hours as needed (fever/pain).    Historical Provider, Hooper  alum & mag hydroxide-simeth (MAALOX/MYLANTA) 200-200-20 MG/5ML suspension Take 30 mLs by mouth every 6 (six) hours as needed for indigestion or heartburn.    Historical Provider, Hooper  guaiFENesin  (ROBITUSSIN) 100 MG/5ML SOLN Take 10 mLs by mouth every 6 (six) hours as needed for cough or to loosen phlegm (not to exceed 4 doses).    Historical Provider, Hooper  HYDROcodone-acetaminophen (NORCO/VICODIN) 5-325 MG per tablet Take 1-2 tablets by mouth every 4 (four) hours as needed for moderate pain. Patient not taking: Reported on 12/23/2014 06/22/13   Alison Murray, Hooper  loperamide (IMODIUM A-D) 2 MG tablet Take 2 mg by mouth 4 (four) times daily as needed for diarrhea or loose stools.    Historical Provider, Hooper  magnesium hydroxide (MILK OF MAGNESIA) 400 MG/5ML suspension Take 30 mLs by mouth at bedtime as needed for mild constipation.    Historical Provider, Hooper  ondansetron (ZOFRAN) 4 MG tablet Take 1 tablet (4 mg total) by mouth every 6 (six) hours as needed for nausea. Patient not taking: Reported on 12/23/2014 06/22/13   Alison Murray, Hooper  zolpidem (AMBIEN) 5 MG tablet Take 1 tablet (5 mg total) by mouth at bedtime as needed for sleep (insomnia). Patient not taking: Reported on 12/23/2014 06/22/13   Alison Murray, Hooper   Physical Exam: Filed Vitals:   12/23/14 1405 12/23/14 1511 12/23/14 1534 12/23/14 1649  BP: 137/80  111/67 151/100  Pulse: 81  65 84  Temp: 97.6 F (36.4 C) 98.2 F (36.8 C) 97.4 F (36.3 C)   TempSrc: Oral  Rectal   Resp: SpO2: 100%  96% 100%    General:  sleeping when I entered the room, opens eyes when called   Eyes: no scleral icterus  ENT: moist oropharynx  Neck: supple, no lymphadenopathy  Cardiovascular: regular rate without MRG; 2+ peripheral pulses, no JVD, no peripheral edema  Respiratory: CTA biL, good air movement without wheezing, rhonchi or crackled  Abdomen: soft, non tender to palpation, positive bowel sounds,  Skin: no rashes  Musculoskeletal: normal bulk and tone, no joint swelling  Neurologic: moves all 4 extremities, not cooperative with commands   Labs on Admission:  Basic Metabolic Panel:  Recent Labs Lab 12/23/14 1450  NA 142   K 4.0  CL 103  CO2 23  GLUCOSE 105*  BUN 18  CREATININE 0.90  CALCIUM 9.5   Liver Function Tests:  Recent Labs Lab 12/23/14 1450  AST 47*  ALT 14  ALKPHOS 80  BILITOT 1.9*  PROT 7.7  ALBUMIN 3.6   CBC:  Recent Labs Lab 12/23/14 1450  WBC 10.7*  NEUTROABS 8.2*  HGB 14.1  HCT 43.1  MCV 93.9  PLT 264   CBG:  Recent Labs Lab 12/23/14 1404  GLUCAP 88    Radiological Exams on Admission: Dg Chest 2 View  12/23/2014   CLINICAL DATA:  Altered mental status.  EXAM: CHEST  2 VIEW  COMPARISON:  None.  FINDINGS: Lungs are clear. Heart size is normal. The aorta is tortuous. No pneumothorax or pleural effusion.  IMPRESSION: No  acute disease.   Electronically Signed   By: Drusilla Kanner M.D.   On: 12/23/2014 15:02   Ct Head Wo Contrast  12/23/2014   CLINICAL DATA:  Altered mental status.  EXAM: CT HEAD WITHOUT CONTRAST  TECHNIQUE: Contiguous axial images were obtained from the base of the skull through the vertex without intravenous contrast.  COMPARISON:  CT scan of March 28, 2013.  FINDINGS: Bony calvarium appears intact. Left ethmoid sinusitis is noted. Mild diffuse cortical atrophy is noted. Mild chronic ischemic white matter disease is noted. No mass effect or midline shift is noted. Ventricular size is within normal limits. There is no evidence of mass lesion, hemorrhage or acute infarction.  IMPRESSION: Mild diffuse cortical atrophy. Mild chronic ischemic white matter disease. Mild left ethmoid sinusitis. No acute intracranial abnormality seen.   Electronically Signed   By: Lupita Raider, M.D.   On: 12/23/2014 15:11    EKG: Independently reviewed. Sinus rhythm  Assessment/Plan Active Problems:   Dementia   Schizophrenia   Acute encephalopathy   Acute encephalopathy  - This is likely multifactorial, patient with underlying dementia as well as schizophrenia  - Neurology evaluated patient, and think this is likely due to infectious process versus underlying  encephalopathy - She has a history of osteomyelitis in the right foot, exam reveals no concerning features for cellulitis or abscess  - We'll check urinalysis  - MRI brain per neurology recommendations   Dementia   Schizophrenia  Isolated elevation of bilirubin  - no reported nausea/vomiting, no abdominal tenderness on exam, will repeat LFTs in am  Diet: NPO, SLP pending Fluids: NS DVT Prophylaxis: Lovenox  Code Status: Full, presumed. Difficult to address code status with sister over the phone, appears hard of hearing and with poor insight Family Communication: d/w sister   Time spent: 15  Kristina Bertone M. Elvera Lennox, Hooper [facility] Pager [phone]  If 7PM-7AM, please contact night-coverage [email or web address] Password [facility] 12/23/2014, 4:56 PM

## 2014-12-23 NOTE — ED Notes (Signed)
cbg 88...Marland Kitchen.per Tawanna Coolerodd

## 2014-12-23 NOTE — ED Notes (Signed)
Hard stick unable to get enough for  APTT INR

## 2014-12-23 NOTE — Discharge Instructions (Signed)

## 2014-12-24 DIAGNOSIS — E876 Hypokalemia: Secondary | ICD-10-CM

## 2014-12-24 DIAGNOSIS — G9340 Encephalopathy, unspecified: Secondary | ICD-10-CM | POA: Diagnosis not present

## 2014-12-24 DIAGNOSIS — L8990 Pressure ulcer of unspecified site, unspecified stage: Secondary | ICD-10-CM | POA: Diagnosis not present

## 2014-12-24 DIAGNOSIS — F0390 Unspecified dementia without behavioral disturbance: Secondary | ICD-10-CM | POA: Diagnosis not present

## 2014-12-24 LAB — CBC
HEMATOCRIT: 38.9 % (ref 36.0–46.0)
HEMOGLOBIN: 12.7 g/dL (ref 12.0–15.0)
MCH: 30.1 pg (ref 26.0–34.0)
MCHC: 32.6 g/dL (ref 30.0–36.0)
MCV: 92.2 fL (ref 78.0–100.0)
Platelets: 253 10*3/uL (ref 150–400)
RBC: 4.22 MIL/uL (ref 3.87–5.11)
RDW: 15.6 % — ABNORMAL HIGH (ref 11.5–15.5)
WBC: 8.5 10*3/uL (ref 4.0–10.5)

## 2014-12-24 LAB — MAGNESIUM: Magnesium: 2 mg/dL (ref 1.7–2.4)

## 2014-12-24 LAB — COMPREHENSIVE METABOLIC PANEL
ALBUMIN: 2.8 g/dL — AB (ref 3.5–5.0)
ALK PHOS: 66 U/L (ref 38–126)
ALT: 11 U/L — ABNORMAL LOW (ref 14–54)
ANION GAP: 15 (ref 5–15)
AST: 26 U/L (ref 15–41)
BUN: 13 mg/dL (ref 6–20)
CO2: 25 mmol/L (ref 22–32)
CREATININE: 0.79 mg/dL (ref 0.44–1.00)
Calcium: 8.7 mg/dL — ABNORMAL LOW (ref 8.9–10.3)
Chloride: 106 mmol/L (ref 101–111)
GFR calc non Af Amer: >60 mL/min (ref >60)
Glucose, Bld: 75 mg/dL (ref 65–99)
Potassium: 2.5 mmol/L — CL (ref 3.5–5.1)
Sodium: 146 mmol/L — ABNORMAL HIGH (ref 135–145)
Total Bilirubin: 1.6 mg/dL — ABNORMAL HIGH (ref 0.3–1.2)
Total Protein: 5.9 g/dL — ABNORMAL LOW (ref 6.5–8.1)

## 2014-12-24 LAB — TSH: TSH: 0.501 u[IU]/mL (ref 0.350–4.500)

## 2014-12-24 LAB — VALPROIC ACID LEVEL

## 2014-12-24 LAB — SEDIMENTATION RATE: SED RATE: 22 mm/h (ref 0–22)

## 2014-12-24 LAB — C-REACTIVE PROTEIN: CRP: 3 mg/dL — ABNORMAL HIGH (ref <1.0)

## 2014-12-24 MED ORDER — CETYLPYRIDINIUM CHLORIDE 0.05 % MT LIQD
7.0000 mL | Freq: Two times a day (BID) | OROMUCOSAL | Status: DC
  Administered 2014-12-25 – 2014-12-29 (×8): 7 mL via OROMUCOSAL

## 2014-12-24 MED ORDER — KCL IN DEXTROSE-NACL 40-5-0.9 MEQ/L-%-% IV SOLN
INTRAVENOUS | Status: AC
  Administered 2014-12-24 – 2014-12-25 (×2): via INTRAVENOUS
  Filled 2014-12-24 (×2): qty 1000

## 2014-12-24 MED ORDER — POLYETHYLENE GLYCOL 3350 17 G PO PACK
17.0000 g | PACK | Freq: Every day | ORAL | Status: DC
  Administered 2014-12-25 – 2014-12-28 (×4): 17 g via ORAL
  Filled 2014-12-24 (×4): qty 1

## 2014-12-24 MED ORDER — ENSURE ENLIVE PO LIQD
237.0000 mL | Freq: Two times a day (BID) | ORAL | Status: DC
  Administered 2014-12-25 – 2014-12-28 (×6): 237 mL via ORAL

## 2014-12-24 MED ORDER — POTASSIUM CHLORIDE CRYS ER 20 MEQ PO TBCR
40.0000 meq | EXTENDED_RELEASE_TABLET | ORAL | Status: AC
  Filled 2014-12-24: qty 2

## 2014-12-24 MED ORDER — LORAZEPAM 0.5 MG PO TABS
0.2500 mg | ORAL_TABLET | Freq: Two times a day (BID) | ORAL | Status: DC | PRN
  Administered 2014-12-25: 0.25 mg via ORAL
  Filled 2014-12-24: qty 1

## 2014-12-24 MED ORDER — SENNOSIDES-DOCUSATE SODIUM 8.6-50 MG PO TABS
2.0000 | ORAL_TABLET | Freq: Two times a day (BID) | ORAL | Status: DC
  Administered 2014-12-24 – 2014-12-29 (×10): 2 via ORAL
  Filled 2014-12-24 (×10): qty 2

## 2014-12-24 MED ORDER — POTASSIUM CHLORIDE 10 MEQ/100ML IV SOLN
10.0000 meq | INTRAVENOUS | Status: AC
  Administered 2014-12-24 (×4): 10 meq via INTRAVENOUS
  Filled 2014-12-24 (×4): qty 100

## 2014-12-24 NOTE — Evaluation (Signed)
Clinical/Bedside Swallow Evaluation Patient Details  Name: [name] Barbary MRN: [id number] Date of Birth: 1935/03/21  Today's Date: 12/24/2014 Time: SLP Start Time (ACUTE ONLY): 0910 SLP Stop Time (ACUTE ONLY): 0929 SLP Time Calculation (min) (ACUTE ONLY): 19 min  Past Medical History:  Past Medical History  Diagnosis Date  . Alzheimer's dementia   . Schizophrenia   . Tinea pedis   . Bilateral lower extremity edema   . Hypokalemia    Past Surgical History: History reviewed. No pertinent past surgical history. HPI:  [name] Akhter is a 80 y.o. female has a past medical history significant for Alzheimer's dementia, schizophrenia, was being [facility] as she has been drowsy, weak and less active for the past few days. Code stroke was activated [facility] and patient was transferred [facility] where she was evaluated by neurology. Since her symptoms have been going on for a few days, cold stroke was canceled and it is felt that stroke is less likely as patient has underlying medical conditions that could explain her mental status. In the emergency room, on my evaluation, patient is alert, can answer basic questions, however she is oriented to person only and cannot provide any reliable history. I was able to get in touch with patient's sister [name], to whom it was quite difficult to discuss as she appeared hard of hearing. She was able to tell me that patient was maybe a little bit more lethargic illness far back as a month ago. In the emergency room, patient has a mild leukocytosis of 10.7, she has borderline elevation of her bilirubin. TRH was asked for admission for acute encephalopathy.   Assessment / Plan / Recommendation Clinical Impression  Clinical swallowing evaluation was completed.  The patient presented with oral/pharyngeal dysphagia characterized by delayed, discoordinated oral swallow and mildy delayed swallow  trigger.  Overt s/s of aspiration were not noted.  Recommend dysphagia 1 diet with thin liquids.  Meds crushed in puree.  ST to follow up for therapeutic diet tolerance and possible diet advancement.      Aspiration Risk  Mild    Diet Recommendation Dysphagia 1 (Puree);Thin   Medication Administration: Crushed with puree Compensations: Slow rate    Other  Recommendations Oral Care Recommendations: Oral care BID   Follow Up Recommendations       Frequency and Duration min 2x/week  2 weeks   Pertinent Vitals/Pain None reported.       Swallow Study Prior Functional Status       General Date of Onset: 12/23/14 Other Pertinent Information: [name] Wiland is a 80 y.o. female has a past medical history significant for Alzheimer's dementia, schizophrenia, [facility] as she has been drowsy, weak and less active for the past few days. Code stroke was activated [facility] and [facility] was [facility] where she was evaluated by neurology. Since her symptoms have been going on for a few days, cold stroke was canceled and it is felt that stroke is less likely as patient has underlying medical conditions that could explain her mental status. In the emergency room, on my evaluation, patient is alert, can answer basic questions, however she is oriented to person only and cannot provide any reliable history. I was able to get in touch with patient's sister [name], to whom it was quite difficult to discuss as she appeared hard of hearing.  She was able to tell me that patient was maybe a little bit more lethargic illness far back as a month ago. In the emergency room, patient has a mild leukocytosis of 10.7, she has borderline elevation of her bilirubin. TRH was asked for admission for acute encephalopathy. Type of Study: Bedside swallow evaluation Previous Swallow Assessment: None noted. Diet Prior to this Study:  NPO Temperature Spikes Noted: No Respiratory Status: Room air History of Recent Intubation: No Behavior/Cognition: Alert;Cooperative;Doesn't follow directions Oral Cavity - Dentition: Edentulous (upper dentures) Self-Feeding Abilities: Total assist Patient Positioning: Upright in bed Baseline Vocal Quality: Normal Volitional Cough: Strong Volitional Swallow: Able to elicit    Oral/Motor/Sensory Function Labial ROM: Within Functional Limits Labial Symmetry: Within Functional Limits Labial Strength:  (unable to fully assess, overt issues were not observed) Lingual ROM:  (Movements were very slow, and tremors were noted.) Lingual Symmetry: Within Functional Limits Lingual Strength:  (unable to fully assess, overt issues were not observed) Facial ROM: Within Functional Limits Facial Symmetry: Within Functional Limits Mandible: Within Functional Limits   Ice Chips Ice chips: Not tested   Thin Liquid Thin Liquid: Impaired Presentation: Cup;Spoon;Straw Oral Phase Impairments: Impaired anterior to posterior transit Oral Phase Functional Implications: Oral holding Pharyngeal  Phase Impairments: Suspected delayed Swallow    Nectar Thick Nectar Thick Liquid: Not tested   Honey Thick Honey Thick Liquid: Not tested   Puree Puree: Impaired Presentation: Spoon Oral Phase Impairments: Impaired anterior to posterior transit Oral Phase Functional Implications: Oral holding;Prolonged oral transit Pharyngeal Phase Impairments: Suspected delayed Swallow   Solid   GO    Solid: Impaired (dual texture) Presentation: Spoon Oral Phase Impairments: Impaired anterior to posterior transit;Poor awareness of bolus;Impaired mastication Oral Phase Functional Implications: Oral residue;Oral holding Pharyngeal Phase Impairments: Suspected delayed Jack Quarto, Efraim Kaufmann N 12/24/2014,9:39 AM  Dimas Aguas, MA, CCC-SLP Acute Rehab SLP [phone]

## 2014-12-24 NOTE — Progress Notes (Signed)
[facility] to [facility] concerning Bonnie MunchStella Hooper baseline. She was walking with a walker but not far. Was able to feed herself but refuses dinner a lot. It took her a long time to take her medications. Alert and Oriented x 4.

## 2014-12-24 NOTE — Progress Notes (Signed)
PROGRESS NOTE  [name] FLX:[id number] DOB: [birth year] DOA: 12/23/2014 PCP: Bonnie Parker, MD  HPI/Recap of past 24 hours:  Very lethargic, maintaining air way, not following commend,   Assessment/Plan: Active Problems:   Dementia   Schizophrenia   Acute encephalopathy   Pressure ulcer  Acute encephalopathy vs progressive dementia:  UDS negative. MRI brain on acute findings. Tele with sinus rhythm, on room air, no hypoxia.  no apparent infection, very lethargic, not following commend, on aspiration precaution will check hiv/rpr/tsh/dapakote level/esr/crp.  Will change scheduled ativan to bid prn.  Continue ivf.  Neurology consulted.  Palliative care consult for goal of care.  Hypokalemia: replace k.  Sacral decubitus ulcer presented on presentation: woc  H/o schizophrenia: continue home meds.   Code Status: full  Family Communication: none at bedside  Disposition Plan: remain in patient   Consultants:  Neurology  Palliative care   Procedures:  none  Antibiotics:  none   Objective: BP 148/80 mmHg  Pulse 72  Temp(Src) 97.8 F (36.6 C) (Axillary)  Resp 18  SpO2 98%  Intake/Output Summary (Last 24 hours) at 12/24/14 1354 Last data filed at 12/24/14 1015  Gross per 24 hour  Intake 789.17 ml  Output      0 ml  Net 789.17 ml   There were no vitals filed for this visit.  Exam:   General:  Very lethargic, not open eyes by sternal rubbing, not following commend  Cardiovascular: RRR  Respiratory: CTABL  Abdomen: Soft/ND/NT, positive BS  Musculoskeletal: No Edema  Neuro: no open eye, not follow commend ,not able to perform detailed exam.  Data Reviewed: Basic Metabolic Panel:  Recent Labs Lab 12/23/14 1450 12/24/14 0310  NA 142 146*  K 4.0 2.5*  CL 103 106  CO2 23 25  GLUCOSE 105* 75  BUN 18 13  CREATININE 0.90 0.79  CALCIUM 9.5 8.7*  MG  --  2.0   Liver Function Tests:  Recent Labs Lab 12/23/14 1450 12/24/14 0310    AST 47* 26  ALT 14 11*  ALKPHOS 80 66  BILITOT 1.9* 1.6*  PROT 7.7 5.9*  ALBUMIN 3.6 2.8*   No results for input(s): LIPASE, AMYLASE in the last 168 hours. No results for input(s): AMMONIA in the last 168 hours. CBC:  Recent Labs Lab 12/23/14 1450 12/24/14 0310  WBC 10.7* 8.5  NEUTROABS 8.2*  --   HGB 14.1 12.7  HCT 43.1 38.9  MCV 93.9 92.2  PLT 264 253   Cardiac Enzymes:   No results for input(s): CKTOTAL, CKMB, CKMBINDEX, TROPONINI in the last 168 hours. BNP (last 3 results) No results for input(s): BNP in the last 8760 hours.  ProBNP (last 3 results) No results for input(s): PROBNP in the last 8760 hours.  CBG:  Recent Labs Lab 12/23/14 1404  GLUCAP 88    No results found for this or any previous visit (from the past 240 hour(s)).   Studies: Dg Chest 2 View  12/23/2014   CLINICAL DATA:  Altered mental status.  EXAM: CHEST  2 VIEW  COMPARISON:  None.  FINDINGS: Lungs are clear. Heart size is normal. The aorta is tortuous. No pneumothorax or pleural effusion.  IMPRESSION: No acute disease.   Electronically Signed   By: Drusilla Kanner M.D.   On: 12/23/2014 15:02   Ct Head Wo Contrast  12/23/2014   CLINICAL DATA:  Altered mental status.  EXAM: CT HEAD WITHOUT CONTRAST  TECHNIQUE: Contiguous axial images were obtained from the base  of the skull through the vertex without intravenous contrast.  COMPARISON:  CT scan of March 28, 2013.  FINDINGS: Bony calvarium appears intact. Left ethmoid sinusitis is noted. Mild diffuse cortical atrophy is noted. Mild chronic ischemic white matter disease is noted. No mass effect or midline shift is noted. Ventricular size is within normal limits. There is no evidence of mass lesion, hemorrhage or acute infarction.  IMPRESSION: Mild diffuse cortical atrophy. Mild chronic ischemic white matter disease. Mild left ethmoid sinusitis. No acute intracranial abnormality seen.   Electronically Signed   By: Marijo Conception, M.D.   On: 12/23/2014  15:11   Mr Brain Wo Contrast  12/23/2014   CLINICAL DATA:  Acute encephalopathy  EXAM: MRI HEAD WITHOUT CONTRAST  TECHNIQUE: Multiplanar, multiecho pulse sequences of the brain and surrounding structures were obtained without intravenous contrast.  COMPARISON:  CT head 12/23/2014  FINDINGS: Image quality degraded by mild to moderate motion  Moderate atrophy.  Negative for hydrocephalus  Negative for acute infarct. Mild to moderate chronic microvascular ischemic change in the white matter. Brainstem and cerebellum intact.  Negative for hemorrhage or mass  Paranasal sinuses reveal mild mucosal edema left ethmoid sinus. Normal orbit. Pituitary not enlarged.  IMPRESSION: Image quality degraded by motion  Generalized atrophy with chronic microvascular ischemia. No acute intracranial abnormality.   Electronically Signed   By: Franchot Gallo M.D.   On: 12/23/2014 18:44    Scheduled Meds: . antiseptic oral rinse  7 mL Mouth Rinse BID  . divalproex  500 mg Oral QHS  . enoxaparin (LOVENOX) injection  40 mg Subcutaneous Q24H  . feeding supplement (ENSURE ENLIVE)  237 mL Oral BID BM  . LORazepam  0.25 mg Oral BID  . mirtazapine  7.5 mg Oral QHS  . olopatadine  1 drop Both Eyes BID  . polyvinyl alcohol  1 drop Both Eyes BID  . potassium chloride  40 mEq Oral Q4H  . sodium chloride  3 mL Intravenous Q12H  . venlafaxine XR  75 mg Oral Q breakfast  . ziprasidone  80 mg Oral BID WC    Continuous Infusions: . dextrose 5 % and 0.9 % NaCl with KCl 40 mEq/L       Time spent: 55mins  [name] MD, PhD  [facility] Pager [phone]. If 7PM-7AM, please contact night-coverage at [email or web address], password [facility] 12/24/2014, 1:54 PM  LOS: 1 day

## 2014-12-24 NOTE — Progress Notes (Signed)
Initial Nutrition Assessment  DOCUMENTATION CODES:  Not applicable  INTERVENTION: - Will order Ensure Enlive BID, each supplement provides 350 kcal and 20 grams of protein - RD will continue to monitor for needs  NUTRITION DIAGNOSIS:  Increased nutrient needs related to wound healing as evidenced by estimated needs.  GOAL:  Patient will meet greater than or equal to 90% of their needs  MONITOR:  PO intake, Supplement acceptance, Weight trends, Labs  REASON FOR ASSESSMENT:  Malnutrition Screening Tool  ASSESSMENT: 80 y.o. female has a past medical history significant for Alzheimer's dementia, schizophrenia, [facility] as she has been drowsy, weak and less active for the past few days. Code stroke was [facility] and [facility] was [facility] where she was evaluated by neurology.  Pt seen for MST. BMI unable to be calculated with accuracy as no new weight since 06/16/13 available. Needs based on this weight as no other weight available and no family present.  Pt with hx of Alzheimer's dementia and schizophrenia. She did not awake to name call x2. Visualized breakfast tray still in room with 50-75% completion of oatmeal. Unable to determine if pt is meeting needs. Some mild muscle wasting, no fat wasting noted but more thorough physical assessment may be warranted when pt awake.   Will order Ensure Enlive BID to supplement. Medications reviewed. Labs reviewed; Na: 146 mmol/L, K: 2.5 mmol/L, Ca: 8.7 mg/dL.  Height:  Ht Readings from Last 1 Encounters:  06/16/13 4\' 9"  (1.448 m)    Weight:  Wt Readings from Last 1 Encounters:  06/16/13 195 lb 12.3 oz (88.8 kg)    Ideal Body Weight:     Wt Readings from Last 10 Encounters:  06/16/13 195 lb 12.3 oz (88.8 kg)    BMI:  There is no weight on file to calculate BMI.  Estimated Nutritional Needs:  Kcal:  1800-2000  Protein:   80-90 grams  Fluid:  2.5 L/day  Skin:  Wound (see comment) (Stage 3 R buttocks pressure ulcer)  Diet Order:  DIET - DYS 1 Room service appropriate?: Yes; Fluid consistency:: Thin  EDUCATION NEEDS:  No education needs identified at this time   Intake/Output Summary (Last 24 hours) at 12/24/14 1233 Last data filed at 12/24/14 1015  Gross per 24 hour  Intake 789.17 ml  Output      0 ml  Net 789.17 ml    Last BM:  PTA    Trenton GammonJessica Amjad Fikes, RD, LDN Inpatient Clinical Dietitian Pager # [phone] After hours/weekend pager # [phone]

## 2014-12-25 DIAGNOSIS — R627 Adult failure to thrive: Secondary | ICD-10-CM | POA: Diagnosis present

## 2014-12-25 DIAGNOSIS — Z79899 Other long term (current) drug therapy: Secondary | ICD-10-CM | POA: Diagnosis not present

## 2014-12-25 DIAGNOSIS — F0390 Unspecified dementia without behavioral disturbance: Secondary | ICD-10-CM | POA: Diagnosis not present

## 2014-12-25 DIAGNOSIS — F0391 Unspecified dementia with behavioral disturbance: Secondary | ICD-10-CM | POA: Diagnosis not present

## 2014-12-25 DIAGNOSIS — B353 Tinea pedis: Secondary | ICD-10-CM | POA: Diagnosis present

## 2014-12-25 DIAGNOSIS — R634 Abnormal weight loss: Secondary | ICD-10-CM | POA: Diagnosis present

## 2014-12-25 DIAGNOSIS — G309 Alzheimer's disease, unspecified: Secondary | ICD-10-CM | POA: Diagnosis present

## 2014-12-25 DIAGNOSIS — F0280 Dementia in other diseases classified elsewhere without behavioral disturbance: Secondary | ICD-10-CM | POA: Diagnosis present

## 2014-12-25 DIAGNOSIS — E876 Hypokalemia: Secondary | ICD-10-CM | POA: Diagnosis present

## 2014-12-25 DIAGNOSIS — L89313 Pressure ulcer of right buttock, stage 3: Secondary | ICD-10-CM | POA: Diagnosis present

## 2014-12-25 DIAGNOSIS — Z515 Encounter for palliative care: Secondary | ICD-10-CM | POA: Diagnosis not present

## 2014-12-25 DIAGNOSIS — R000 Tachycardia, unspecified: Secondary | ICD-10-CM | POA: Diagnosis not present

## 2014-12-25 DIAGNOSIS — G9340 Encephalopathy, unspecified: Secondary | ICD-10-CM | POA: Diagnosis present

## 2014-12-25 DIAGNOSIS — L8990 Pressure ulcer of unspecified site, unspecified stage: Secondary | ICD-10-CM | POA: Diagnosis not present

## 2014-12-25 DIAGNOSIS — F209 Schizophrenia, unspecified: Secondary | ICD-10-CM | POA: Diagnosis not present

## 2014-12-25 LAB — COMPREHENSIVE METABOLIC PANEL
ALBUMIN: 2.7 g/dL — AB (ref 3.5–5.0)
ALK PHOS: 66 U/L (ref 38–126)
ALT: 10 U/L — AB (ref 14–54)
AST: 22 U/L (ref 15–41)
Anion gap: 7 (ref 5–15)
BUN: <5 mg/dL — ABNORMAL LOW (ref 6–20)
CALCIUM: 8.7 mg/dL — AB (ref 8.9–10.3)
CO2: 31 mmol/L (ref 22–32)
Chloride: 106 mmol/L (ref 101–111)
Creatinine, Ser: 0.8 mg/dL (ref 0.44–1.00)
GFR calc Af Amer: >60 mL/min (ref >60)
GFR calc non Af Amer: >60 mL/min (ref >60)
Glucose, Bld: 126 mg/dL — ABNORMAL HIGH (ref 65–99)
POTASSIUM: 2.9 mmol/L — AB (ref 3.5–5.1)
Sodium: 144 mmol/L (ref 135–145)
TOTAL PROTEIN: 6.1 g/dL — AB (ref 6.5–8.1)
Total Bilirubin: 1.2 mg/dL (ref 0.3–1.2)

## 2014-12-25 LAB — CBC
HCT: 41.2 % (ref 36.0–46.0)
Hemoglobin: 13.4 g/dL (ref 12.0–15.0)
MCH: 30.5 pg (ref 26.0–34.0)
MCHC: 32.5 g/dL (ref 30.0–36.0)
MCV: 93.6 fL (ref 78.0–100.0)
Platelets: 181 10*3/uL (ref 150–400)
RBC: 4.4 MIL/uL (ref 3.87–5.11)
RDW: 15.4 % (ref 11.5–15.5)
WBC: 7.1 10*3/uL (ref 4.0–10.5)

## 2014-12-25 LAB — RPR: RPR Ser Ql: NONREACTIVE

## 2014-12-25 LAB — HIV ANTIBODY (ROUTINE TESTING W REFLEX): HIV SCREEN 4TH GENERATION: NONREACTIVE

## 2014-12-25 MED ORDER — VALPROIC ACID 250 MG/5ML PO SYRP
250.0000 mg | ORAL_SOLUTION | Freq: Two times a day (BID) | ORAL | Status: DC
  Administered 2014-12-25 – 2014-12-27 (×5): 250 mg via ORAL
  Filled 2014-12-25 (×6): qty 5

## 2014-12-25 MED ORDER — POTASSIUM CHLORIDE 10 MEQ/100ML IV SOLN
10.0000 meq | INTRAVENOUS | Status: AC
  Administered 2014-12-25 (×4): 10 meq via INTRAVENOUS
  Filled 2014-12-25 (×4): qty 100

## 2014-12-25 MED ORDER — POTASSIUM CHLORIDE 20 MEQ/15ML (10%) PO SOLN
40.0000 meq | Freq: Two times a day (BID) | ORAL | Status: DC
  Filled 2014-12-25: qty 30

## 2014-12-25 NOTE — Consult Note (Addendum)
WOC wound consult note Reason for Consult: sacral wound Wound type: Stage III Pressure ulcer right ischium  Pressure Ulcer POA: Yes Measurement: 3cm x 3cm x 0.2cm  Wound bed: pink, moist, with some fibrin and epithelial buds Drainage (amount, consistency, odor) moderate, serosanguinous, no odor  Periwound: intact  Dressing procedure/placement/frequency: Soft silicone foam dressing to protect and insulate, encourage moist wound healing.  Change every 3 days and PRN soilage.  Patient is minimally responsive.  Turn from side to side at least every two hours.   Discussed POC with bedside nurse.  Re consult if needed, will not follow at this time. Thanks  Milca Sytsma [employer] RN, CWOCN [phone])

## 2014-12-25 NOTE — Evaluation (Signed)
Physical Therapy Evaluation Patient Details Name: [name] MRN: [id number] DOB: May 04, 1935 Today's Date: 12/25/2014   History of Present Illness  80 y.o. female has a past medical history significant for Alzheimer's dementia, schizophrenia, was being brought [facility] from [facility] nursing facility as she has been drowsy, weak and less active for the past few days. Code stroke was activated [facility] and patient was transferred to [facility] where she was evaluated by neurology. MRI negative for stroke findings.   Clinical Impression  Pt adm due to above. Pt limited in evaluation due to lethargy and lack of participation. Pt required max (A) for supine <> sit. Noted tremor in Rt hand during activity, unclear if tremor is baseline vs new symptom. Pt to benefit from skilled acute PT to address deficits and increase functional mobility. Pt from [facility], will need 24/7 (A) upon return at this time.     Follow Up Recommendations SNF;Supervision/Assistance - 24 hour    Equipment Recommendations  Other (comment) (TBD)    Recommendations for Other Services OT consult     Precautions / Restrictions Precautions Precautions: Fall Restrictions Weight Bearing Restrictions: No      Mobility  Bed Mobility Overal bed mobility: Needs Assistance Bed Mobility: Supine to Sit;Sit to Supine;Rolling Rolling: Mod assist   Supine to sit: Max assist;HOB elevated Sit to supine: Max assist;HOB elevated   General bed mobility comments: pt lethargic; able to hold herself EOB when (A) to sitting position; use of draw pad to bring hips to EOB; pt with decr participation  Transfers                 General transfer comment: unable to safely assess due to lethargy  Ambulation/Gait                Stairs            Wheelchair Mobility    Modified Rankin (Stroke Patients Only)       Balance                                              Pertinent Vitals/Pain Pain Assessment: No/denies pain    Home Living Family/patient expects to be discharged to:: Skilled nursing facility                 Additional Comments: per chart; RN called facility [facility]    Prior Function Level of Independence: Needs assistance         Comments: per chart pt was ambulatory with RW for short distances     Hand Dominance        Extremity/Trunk Assessment   Upper Extremity Assessment: Defer to OT evaluation           Lower Extremity Assessment: Generalized weakness (B LE appeared to be contracted;pt resistful of evaluation)      Cervical / Trunk Assessment: Other exceptions  Communication   Communication: Other (comment) (limited due to lethargy)  Cognition Arousal/Alertness: Lethargic;Suspect due to medications Behavior During Therapy: Flat affect Overall Cognitive Status: Difficult to assess                      General Comments      Exercises        Assessment/Plan    PT Assessment Patient needs continued PT services  PT Diagnosis Difficulty walking;Generalized weakness;Altered mental status   PT Problem List Decreased strength;Decreased activity tolerance;Decreased balance;Decreased mobility;Decreased cognition;Decreased knowledge of use of DME;Decreased safety awareness  PT Treatment Interventions DME instruction;Gait training;Functional mobility training;Therapeutic activities;Therapeutic exercise;Balance training;Neuromuscular re-education;Patient/family education   PT Goals (Current goals can be found in the Care Plan section) Acute Rehab PT Goals PT Goal Formulation: Patient unable to participate in goal setting Time For Goal Achievement: 01/08/15 Potential to Achieve Goals: Fair    Frequency Min 3X/week   Barriers to discharge   from [facility]    Co-evaluation               End of Session   Activity Tolerance: Patient limited by  lethargy Patient left: in bed;with call bell/phone within reach;with bed alarm set Nurse Communication: Mobility status;Precautions         Time: [id number] PT Time Calculation (min) (ACUTE ONLY): 15 min   Charges:   PT Evaluation $Initial PT Evaluation Tier I: 1 Procedure     PT G Codes:        Tinsley Everman, [place] PT  12/25/2014, 8:52 AM

## 2014-12-25 NOTE — Progress Notes (Signed)
PROGRESS NOTE  [name] DZH:[id number] DOB: 1934/08/18 DOA: 12/23/2014 PCP: Sande Brothers, MD  HPI/Recap of past 24 hours:  More alert compare to yesterday,  attempt to  follow commend this am, able to talk, denies pain. Still very lethargic, drift back to sleep during conversation. Oriented to self.  Assessment/Plan: Active Problems:   Dementia   Schizophrenia   Acute encephalopathy   Pressure ulcer  Acute encephalopathy vs progressive dementia:  UDS negative. Ct head/MRI brain on acute findings. Tele with sinus rhythm, on room air, no hypoxia.  no apparent infection,   hiv/rpr pending, tsh wnl /dapakote level very low /esr wnl /crp mild elevated  change scheduled ativan to bid prn. Stop remeron.( Per chart review, patient was not on these last hospitalization.) Continue ivf.  Neurology seen patient on admission. Palliative care consult for goal of care. Improving on 7/10 vs fluctuation,   Hypokalemia: persistent, continue replace k. Mag wnl  Sacral decubitus ulcer presented on presentation: woc  H/o schizophrenia: continue home meds.  Dementia/FTT: has discussed at great length with patient's son Zada Girt at  [phone], son reported notice patient started gradual declining 3-75months,  With about 30pounds of weight loss, son also reported patient has caught a cold 5-6 wks ago, has decreased oral intake, less talkative, became more lethargic. Son wish patient to be full code, agree with Ng tube if patient not improving in the next 24-48hrs, does  Agree to talk to palliative care team.   Code Status: full  Family Communication: son over the phone  Disposition Plan: remain in patient   Consultants:  Neurology  Palliative care   Procedures:  none  Antibiotics:  none   Objective: BP 158/70 mmHg  Pulse 76  Temp(Src) 98.3 F (36.8 C) (Oral)  Resp 16  SpO2 94%  Intake/Output Summary (Last 24 hours) at 12/25/14 1231 Last data filed at 12/24/14  1803  Gross per 24 hour  Intake     60 ml  Output      0 ml  Net     60 ml   There were no vitals filed for this visit.  Exam:   General:  Now attempt to follow commend, know her birth date and month, not year. Talk come, but still very lethargic, drift  Back to sleep during conversation  Cardiovascular: RRR  Respiratory: CTABL  Abdomen: Soft/ND/NT, positive BS  Musculoskeletal: No Edema  Neuro: as above  Data Reviewed: Basic Metabolic Panel:  Recent Labs Lab 12/23/14 1450 12/24/14 0310 12/25/14 0450  NA 142 146* 144  K 4.0 2.5* 2.9*  CL 103 106 106  CO2 $Re'23 25 31  'KoL$ GLUCOSE 105* 75 126*  BUN 18 13 <5*  CREATININE 0.90 0.79 0.80  CALCIUM 9.5 8.7* 8.7*  MG  --  2.0  --    Liver Function Tests:  Recent Labs Lab 12/23/14 1450 12/24/14 0310 12/25/14 0450  AST 47* 26 22  ALT 14 11* 10*  ALKPHOS 80 66 66  BILITOT 1.9* 1.6* 1.2  PROT 7.7 5.9* 6.1*  ALBUMIN 3.6 2.8* 2.7*   No results for input(s): LIPASE, AMYLASE in the last 168 hours. No results for input(s): AMMONIA in the last 168 hours. CBC:  Recent Labs Lab 12/23/14 1450 12/24/14 0310 12/25/14 0450  WBC 10.7* 8.5 7.1  NEUTROABS 8.2*  --   --   HGB 14.1 12.7 13.4  HCT 43.1 38.9 41.2  MCV 93.9 92.2 93.6  PLT 264 253 181   Cardiac Enzymes:  No results for input(s): CKTOTAL, CKMB, CKMBINDEX, TROPONINI in the last 168 hours. BNP (last 3 results) No results for input(s): BNP in the last 8760 hours.  ProBNP (last 3 results) No results for input(s): PROBNP in the last 8760 hours.  CBG:  Recent Labs Lab 12/23/14 1404  GLUCAP 88    No results found for this or any previous visit (from the past 240 hour(s)).   Studies: No results found.  Scheduled Meds: . antiseptic oral rinse  7 mL Mouth Rinse BID  . enoxaparin (LOVENOX) injection  40 mg Subcutaneous Q24H  . feeding supplement (ENSURE ENLIVE)  237 mL Oral BID BM  . olopatadine  1 drop Both Eyes BID  . polyethylene glycol  17 g Oral  Daily  . polyvinyl alcohol  1 drop Both Eyes BID  . potassium chloride  10 mEq Intravenous Q1 Hr x 4  . senna-docusate  2 tablet Oral BID  . sodium chloride  3 mL Intravenous Q12H  . Valproic Acid  250 mg Oral BID  . venlafaxine XR  75 mg Oral Q breakfast  . ziprasidone  80 mg Oral BID WC    Continuous Infusions: . dextrose 5 % and 0.9 % NaCl with KCl 40 mEq/L 75 mL/hr at 12/25/14 3014     Time spent: 56mins  Sesar Madewell MD, PhD  [facility] Pager [phone]. If 7PM-7AM, please contact night-coverage at [email or web address], password [facility] 12/25/2014, 12:31 PM  LOS: 2 days

## 2014-12-26 DIAGNOSIS — F0391 Unspecified dementia with behavioral disturbance: Secondary | ICD-10-CM

## 2014-12-26 DIAGNOSIS — Z515 Encounter for palliative care: Secondary | ICD-10-CM

## 2014-12-26 LAB — MAGNESIUM: Magnesium: 1.7 mg/dL (ref 1.7–2.4)

## 2014-12-26 LAB — BASIC METABOLIC PANEL
ANION GAP: 6 (ref 5–15)
BUN: 7 mg/dL (ref 6–20)
CALCIUM: 8.5 mg/dL — AB (ref 8.9–10.3)
CHLORIDE: 110 mmol/L (ref 101–111)
CO2: 29 mmol/L (ref 22–32)
Creatinine, Ser: 0.84 mg/dL (ref 0.44–1.00)
GFR calc Af Amer: >60 mL/min (ref >60)
Glucose, Bld: 114 mg/dL — ABNORMAL HIGH (ref 65–99)
Potassium: 3.8 mmol/L (ref 3.5–5.1)
Sodium: 145 mmol/L (ref 135–145)

## 2014-12-26 LAB — AMMONIA: Ammonia: 26 umol/L (ref 9–35)

## 2014-12-26 NOTE — Progress Notes (Addendum)
PROGRESS NOTE  [name]:[id number] DOB: 05-11-1935 DOA: 12/23/2014 PCP: Sande Brothers, MD  HPI/Recap of past 24 hours:  Open eyes to voice, but not  follow commend this am, Hooper 'yes' to all questions.   Assessment/Plan: Active Problems:   Dementia   Schizophrenia   Acute encephalopathy   Pressure ulcer   Palliative care encounter  Acute encephalopathy vs progressive dementia:  UDS negative. Ct head/MRI brain on acute findings. Tele with sinus rhythm, on room air, no hypoxia.  no apparent infection,   hiv/rpr pending, tsh wnl /dapakote level very low /esr wnl /crp mild elevated  change scheduled ativan to bid prn. Stop remeron.( Per chart review, patient was not on these last hospitalization.) Continue ivf.  Neurology seen patient on admission. Palliative care consult for goal of care. Improving on 7/10 vs fluctuation, puree diet and thin liquid per swallow eval on 7/11. Consider psych consult if not improving.  Hypokalemia:  Improved,  Mag wnl  Sacral decubitus ulcer presented on presentation: woc  H/o schizophrenia: continue home meds.  Dementia/FTT: has discussed at great length with patient's son Zada Girt at  [phone], son reported notice patient started gradual declining 3-67month,  With about 30pounds of weight loss, son also reported patient has caught a cold 5-6 wks ago, has decreased oral intake, less talkative, became more lethargic. Son wish patient to be full code, agree with Ng tube if patient not improving in the next 24-48hrs, does  Agree to talk to palliative care team.   Code Status: full  Family Communication: son over the phone  Disposition Plan: remain in patient   Consultants:  Neurology  Palliative care   Procedures:  none  Antibiotics:  none   Objective: BP 133/63 mmHg  Pulse 76  Temp(Src) 97.8 F (36.6 C) (Oral)  Resp 18  SpO2 99%  Intake/Output Summary (Last 24 hours) at 12/26/14 1642 Last data filed at  12/26/14 0914  Gross per 24 hour  Intake    480 ml  Output      0 ml  Net    480 ml   There were no vitals filed for this visit.  Exam:   General:  still very lethargic, drift  Back to sleep during conversation, Hooper 'yes' to all questions  Cardiovascular: RRR  Respiratory: CTABL  Abdomen: Soft/ND/NT, positive BS  Musculoskeletal: No Edema  Neuro: as above  Data Reviewed: Basic Metabolic Panel:  Recent Labs Lab 12/23/14 1450 12/24/14 0310 12/25/14 0450 12/26/14 0440  NA 142 146* 144 145  K 4.0 2.5* 2.9* 3.8  CL 103 106 106 110  CO2 '23 25 31 29  ' GLUCOSE 105* 75 126* 114*  BUN 18 13 <5* 7  CREATININE 0.90 0.79 0.80 0.84  CALCIUM 9.5 8.7* 8.7* 8.5*  MG  --  2.0  --  1.7   Liver Function Tests:  Recent Labs Lab 12/23/14 1450 12/24/14 0310 12/25/14 0450  AST 47* 26 22  ALT 14 11* 10*  ALKPHOS 80 66 66  BILITOT 1.9* 1.6* 1.2  PROT 7.7 5.9* 6.1*  ALBUMIN 3.6 2.8* 2.7*   No results for input(s): LIPASE, AMYLASE in the last 168 hours.  Recent Labs Lab 12/26/14 0440  AMMONIA 26   CBC:  Recent Labs Lab 12/23/14 1450 12/24/14 0310 12/25/14 0450  WBC 10.7* 8.5 7.1  NEUTROABS 8.2*  --   --   HGB 14.1 12.7 13.4  HCT 43.1 38.9 41.2  MCV 93.9 92.2 93.6  PLT 264 253 181  Cardiac Enzymes:   No results for input(s): CKTOTAL, CKMB, CKMBINDEX, TROPONINI in the last 168 hours. BNP (last 3 results) No results for input(s): BNP in the last 8760 hours.  ProBNP (last 3 results) No results for input(s): PROBNP in the last 8760 hours.  CBG:  Recent Labs Lab 12/23/14 1404  GLUCAP 88    Recent Results (from the past 240 hour(s))  Culture, blood (routine x 2)     Status: None (Preliminary result)   Collection Time: 12/25/14  2:37 PM  Result Value Ref Range Status   Specimen Description BLOOD BLOOD RIGHT HAND  Final   Special Requests IN PEDIATRIC BOTTLE 3CC  Final   Culture NO GROWTH 1 DAY  Final   Report Status PENDING  Incomplete  Culture, blood  (routine x 2)     Status: None (Preliminary result)   Collection Time: 12/25/14  3:05 PM  Result Value Ref Range Status   Specimen Description BLOOD BLOOD RIGHT HAND  Final   Special Requests BOTTLES DRAWN AEROBIC ONLY 3CC  Final   Culture NO GROWTH 1 DAY  Final   Report Status PENDING  Incomplete     Studies: No results found.  Scheduled Meds: . antiseptic oral rinse  7 mL Mouth Rinse BID  . enoxaparin (LOVENOX) injection  40 mg Subcutaneous Q24H  . feeding supplement (ENSURE ENLIVE)  237 mL Oral BID BM  . olopatadine  1 drop Both Eyes BID  . polyethylene glycol  17 g Oral Daily  . polyvinyl alcohol  1 drop Both Eyes BID  . senna-docusate  2 tablet Oral BID  . sodium chloride  3 mL Intravenous Q12H  . Valproic Acid  250 mg Oral BID  . venlafaxine XR  75 mg Oral Q breakfast  . ziprasidone  80 mg Oral BID WC    Continuous Infusions:     Time spent: 75mns  Amorette Charrette MD, PhD  [facility] Pager [phone] If 7PM-7AM, please contact night-coverage at [email or web address], password [email or web address]7/04/2015, 4:42 PM  LOS: 3 days

## 2014-12-26 NOTE — Care Management (Signed)
Important Message  Patient Details  Name: [name] MRN: [id number] Date of Birth: 01/12/1935   Medicare Important Message Given:  Yes-second notification given    Orson AloeMegan P Shalom Mcguiness 12/26/2014, 3:49 PM

## 2014-12-26 NOTE — Progress Notes (Signed)
Speech Language Pathology Treatment: Dysphagia  Patient Details Name: [name] MRN: [id number] DOB: [birth year] Today's Date: 12/26/2014 Time: [id number] SLP Time Calculation (min) (ACUTE ONLY): 11 min  Assessment / Plan / RecomCleotis Nippermendation Clinical Impression  Pt with low grade fever today.  Appeared comfortable and responded to approximately 50% of SLP questions.  Pt maintained eye closure throughout entire session.  She willingly accepted small bolus of applesauce, 4 ounces water via straw and 2 ounces orange juice.  She took VERY small applesauce bolus off spoon with prolonged oral transiting.  Do not recommend to advance solids at this time.    Swallowing was much more efficient with liquids and there were no indications of airway compromise.  Suspect liquid nutritional supplements will be helpful to maximize caloric/nutritional intake.    Whitish coating on tongue noted - advised RN.  RN reports pt received crushed white medications.    Note palliative referring pending.  SLP to follow up x1 more later this week hopefully for dietary advancement and family education.     HPI Other Pertinent Information: Bonnie NipperStella M Hooper is a 80 y.o. female has a past medical history significant for Alzheimer's dementia, schizophrenia, [facility] as she has been drowsy, weak and less active for the past few days. [facility] and [facility] where she was evaluated by neurology. Since her symptoms have been going on for a few days, cold stroke was canceled and it is felt that stroke is less likely as patient has underlying medical conditions that could explain her mental status. In the emergency room, on my evaluation, patient is alert, can answer basic questions, however she is oriented to person only and cannot provide any reliable history. I was able to get in touch with patient's  sister [name], to whom it was quite difficult to discuss as she appeared hard of hearing. She was able to tell me that patient was maybe a little bit more lethargic illness far back as a month ago. In the emergency room, patient has a mild leukocytosis of 10.7, she has borderline elevation of her bilirubin. TRH was asked for admission for acute encephalopathy.   Pertinent Vitals Pain Assessment: Faces Faces Pain Scale: No hurt  SLP Plan  Continue with current plan of care    Recommendations Diet recommendations: Dysphagia 1 (puree);Thin liquid (maximize liquid nutritional supplements recommended) Liquids provided via: Straw Medication Administration: Crushed with puree Supervision: Full supervision/cueing for compensatory strategies Compensations: Slow rate;Check for pocketing Postural Changes and/or Swallow Maneuvers: Seated upright 90 degrees;Upright 30-60 min after meal              Oral Care Recommendations: Oral care BID Follow up Recommendations: Other (comment) (TBD) Plan: Continue with current plan of care    GO     Bonnie Burnetamara Shardai Star, MS [facility] SLP [phone]

## 2014-12-26 NOTE — Consult Note (Signed)
ASked to see [name] by '@Dr' . Xu for palliative consult for goals of care for a patient with advanced dementia  Impression: 1. Dementia with schzophrenia/psychotic features 2. FTT 3. Pressure ulcer  Recommendations: 1. Probable return to SNF 2. Asked son, [name], to find a time for family conference re: goals of care, along with his aunt who shares HCPOA. 3. Psych - continue present meds. Consider psychiatric consult 4. D/C telemetry - see orders  HPI [name] is a 80 y.o. female with a past medical history significant for Alzheimer's dementia, schizophrenia, who was brought to [facility] from [facility] as she has been drowsy, weak and less active for the past few days. Code stroke was activated at [facility] and patient was transferred to [facility] where she was evaluated by neurology. Since her symptoms have been going on for a few days, code stroke was canceled and it is felt that stroke is less likely as patient has underlying medical conditions that could explain her mental status. Emergency room evaluation revealed a patient who was alert, could answer basic questions, however she was oriented to person only and could not provide any reliable history. The patient's sister [name] was contacted, but information was quite difficult to obtain as she appeared hard of hearing. She was able to tell me that patient was maybe a little bit more lethargic as far back as a month ago. In the emergency room, patient had a mild leukocytosis of 10.7, she has borderline elevation of her bilirubin. TRH was asked for admission for acute encephalopathy.  Acute neurology evaluation was conducted:  Assessment: 80 y.o. female presenting to hospital after SNF noted three days of increased lethargy. Due to history of acute change code stroke was called. On exam patient has no focal deficits and is lethargic. Likely UTI or underlying  encephalopathy.  Stroke Risk Factors - none   Recommend: 1) MRI brain--if negative no further stroke work up warranted.  2) Evaluate for underlying infection or metabolic derangement, if MRI shows no signs of acute stroke.   Since admission SLP evaluated the patient: "Clinical swallowing evaluation was completed. The patient presented with oral/pharyngeal dysphagia characterized by delayed, discoordinated oral swallow and mildy delayed swallow trigger. Overt s/s of aspiration were not noted. Recommend dysphagia 1 diet with thin liquids. Meds crushed in puree. ST to follow up for therapeutic diet tolerance and possible diet advancement."  MRI brain was negative for acute injury. CT head w/o hemorrhage, mass or injury.   WOC consult obtained for management of Stage III pressure ulcer right ischium [id number] with soft silicone foam dressing to be changed q72 hrs.  Attending discussed patient's condition  at great length with patient's son [name] at [phone], who reported patient started gradual decline 3-23month agowith about 30 pounds of weight loss. He also reported patient had caught a cold 5-6 wks ago, had decreased oral intake, was less talkative and had become more lethargic. Son wish patient to be full code, agreed to Ng feeding tube if patient is not improving PO intake in the next 24-48hrs. He does agree to talk to palliative care team in regard to goals of care.   Scheduled Meds:  antiseptic oral rinse  7 mL Mouth Rinse BID   enoxaparin (LOVENOX) injection  40 mg Subcutaneous Q24H   feeding supplement (ENSURE ENLIVE)  237 mL Oral BID BM   olopatadine  1 drop Both Eyes BID   polyethylene  glycol  17 g Oral Daily   polyvinyl alcohol  1 drop Both Eyes BID   senna-docusate  2 tablet Oral BID   sodium chloride  3 mL Intravenous Q12H   Valproic Acid  250 mg Oral BID   venlafaxine XR  75 mg Oral Q breakfast   ziprasidone  80 mg Oral BID WC   Continuous  Infusions:  PRN Meds:.acetaminophen, alum & mag hydroxide-simeth, benzocaine, LORazepam, sodium chloride  Past Medical History  Diagnosis Date   Alzheimer's dementia    Schizophrenia    Tinea pedis    Bilateral lower extremity edema    Hypokalemia     History reviewed. No pertinent past surgical history.  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 She has had 2 children. She has resided at a long term care facility for 18 months.  Palliative Care Status: medically stable with progressive dementia. Remains a full code at this time  Palliative Prophylaxis: on miralax, no pain issues, wound care in place  Palliative Review of Systems: No anxiety, no observable pain, no bowel problems.   PE:  Filed Vitals:   12/26/14 0655  BP: 116/69  Pulse: 86  Temp: 97.8 F (36.6 C)  Resp: 16   General: elderly black woman in no distress who is non-communicative HEENT- edentulous, no oral lesions noted Cor - 2+ radial, warm extremities, RRR Pul - normal respirations, w/o rales, wheezes, rhonchi Abd - soft, non-tender Neuro - awake, takes liquids. No facial droop. Hand tremor noted.  Derm - ischial decubitus not examined - see WOC note  CBC Latest Ref Rng 12/25/2014 12/24/2014 12/23/2014  WBC 4.0 - 10.5 K/uL 7.1 8.5 10.7(H)  Hemoglobin 12.0 - 15.0 g/dL 13.4 12.7 14.1  Hematocrit 36.0 - 46.0 % 41.2 38.9 43.1  Platelets 150 - 400 K/uL 181 253 264    CMP Latest Ref Rng 12/26/2014 12/25/2014 12/24/2014  Glucose 65 - 99 mg/dL 114(H) 126(H) 75  BUN 6 - 20 mg/dL 7 <5(L) 13  Creatinine 0.44 - 1.00 mg/dL 0.84 0.80 0.79  Sodium 135 - 145  mmol/L 145 144 146(H)  Potassium 3.5 - 5.1 mmol/L 3.8 2.9(L) 2.5(LL)  Chloride 101 - 111 mmol/L 110 106 106  CO2 22 - 32 mmol/L '29 31 25  ' Calcium 8.9 - 10.3 mg/dL 8.5(L) 8.7(L) 8.7(L)  Total Protein 6.5 - 8.1 g/dL - 6.1(L) 5.9(L)  Total Bilirubin 0.3 - 1.2 mg/dL - 1.2 1.6(H)  Alkaline Phos 38 - 126 U/L - 66 66  AST 15 - 41 U/L - 22 26  ALT 14 - 54 U/L - 10(L) 11(L)   CRP 3.0 (H), ESR 22, valproic acid <10, TSH 0.5, RPR neg, HIV screen neg   Imaging CXR 7/8: IMPRESSION: No acute disease.  CT head 7/8: CT head 7/8: IMPRESSION: Mild diffuse cortical atrophy. Mild chronic ischemic white matter disease. Mild left ethmoid sinusitis. No acute intracranial abnormality seen.  MRI brain 7/8: IMPRESSION: Image quality degraded by motion  Generalized atrophy with chronic microvascular ischemia. No acute intracranial abnormality.  A/P  1. Encephalopathy - no major metabolic derangement and no infection. No evidence of stroke. History is difficult to obtain from son who has minimal medical insight. Suspect primary issue is progressive dementia with psychotic features. Valproic acid level <10  Plan Continue psych meds: vanlafaxin for depression, anxiety; valproic acid for mood stabilizer and ziprasidone for pyschotic behavior.  May benefit from psychiatric consult.  2. Pressure Ulcer - addressed  3. Palliative care - no pain issues and general care is addressed. She is for return to SNF. Her son, [name], has little insight to her condition or advanced care planning. Began the conversation by telephone in regard to code status and limits on intrusive care. Informed him of the natural history and trajectory of dementia.  Plan Mr. [name] is asked to call back to set up a convenient time for family conference with him and his Aunt, who share HCPOA, to address code status and goals of care.   Time in 0922 Time out 10:25  Thank you for this consult. Will work to arrange family conf. To plan  disposition and code status.    Adella Hare, MD, [place] Team [phone]

## 2014-12-27 DIAGNOSIS — R000 Tachycardia, unspecified: Secondary | ICD-10-CM

## 2014-12-27 DIAGNOSIS — R627 Adult failure to thrive: Secondary | ICD-10-CM

## 2014-12-27 LAB — BASIC METABOLIC PANEL
Anion gap: 7 (ref 5–15)
BUN: 10 mg/dL (ref 6–20)
CO2: 26 mmol/L (ref 22–32)
Calcium: 8.5 mg/dL — ABNORMAL LOW (ref 8.9–10.3)
Chloride: 104 mmol/L (ref 101–111)
Creatinine, Ser: 0.94 mg/dL (ref 0.44–1.00)
GFR calc Af Amer: >60 mL/min (ref >60)
GFR calc non Af Amer: 57 mL/min — ABNORMAL LOW (ref >60)
GLUCOSE: 85 mg/dL (ref 65–99)
POTASSIUM: 4.9 mmol/L (ref 3.5–5.1)
SODIUM: 137 mmol/L (ref 135–145)

## 2014-12-27 LAB — MAGNESIUM: Magnesium: 1.8 mg/dL (ref 1.7–2.4)

## 2014-12-27 NOTE — Progress Notes (Signed)
Daily Progress Note   Patient Name: [name]       Date: 12/27/2014 DOB: 23-Jul-1934  Age: 80 y.o. MRN#: [id number] Attending Physician: Albertine Grates, MD Primary Care Physician: Ron Parker, MD Admit Date: 12/23/2014  Reason for Consultation/Follow-up: Establishing goals of care  Subjective: More awake but only speaks monosyllables. Interval Events: Episode of tachycardia 7/11 PM noted. No other changes  Length of Stay: 4 days  Current Medications: Scheduled Meds:   antiseptic oral rinse  7 mL Mouth Rinse BID   enoxaparin (LOVENOX) injection  40 mg Subcutaneous Q24H   feeding supplement (ENSURE ENLIVE)  237 mL Oral BID BM   olopatadine  1 drop Both Eyes BID   polyethylene glycol  17 g Oral Daily   polyvinyl alcohol  1 drop Both Eyes BID   senna-docusate  2 tablet Oral BID   sodium chloride  3 mL Intravenous Q12H   Valproic Acid  250 mg Oral BID   venlafaxine XR  75 mg Oral Q breakfast   ziprasidone  80 mg Oral BID WC    Continuous Infusions:    PRN Meds: acetaminophen, alum & mag hydroxide-simeth, benzocaine, LORazepam, sodium chloride  Palliative Performance Scale: 30%     Vital Signs: BP 125/80 mmHg   Pulse 136   Temp(Src) 98.7 F (37.1 C) (Oral)   Resp 16   SpO2 98% SpO2: SpO2: 98 % O2 Device: O2 Device: Not Delivered O2 Flow Rate:    Intake/output summary:  Intake/Output Summary (Last 24 hours) at 12/27/14 1219 Last data filed at 12/27/14 0900  Gross per 24 hour  Intake    363 ml  Output      0 ml  Net    363 ml   LBM:   Baseline Weight:   Most recent weight:    Physical Exam: Gen'l - elderly black woman in no distress HEENT - eyes are open, C&S clear Nor - 2+ radial, RRR Pulm - normal respirations Abd- soft, no guarding or rebound, no tenderness Neuro - awake, does not follow commands, answers with yes and no to simple questions. Not oriented to place or context.              Additional Data Reviewed: Recent Labs     12/25/14  0450  12/26/14  0440  12/27/14  0350  WBC  7.1   --    --   HGB  13.4   --    --   PLT  181   --    --   NA  144  145  137  BUN  <5*  7  10  CREATININE  0.80  0.84  0.94     Problem List:  Patient Active Problem List   Diagnosis Date Noted   Palliative care encounter    Pressure ulcer 12/24/2014   Acute encephalopathy 12/23/2014   Osteomyelitis 06/16/2013   Osteomyelitis of foot, acute 06/16/2013   Dementia 06/16/2013   Schizophrenia 06/16/2013     Palliative Care Assessment & Plan    Code Status:  Full code  Goals of Care:  Return to SNF. ? EoL care  Desire for further Chaplaincy support:no  3. Symptom Management:  No active issues  4. Palliative Prophylaxis:  Stool Softner: on miralax. No report of problems, no record of BM last 48 hrs  5. Prognosis: Unable to determine  5. Discharge Planning: Skilled Nursing Facility for rehab with Palliative care service follow-up  Patient is more awake but  not able to make any medically related decisions. Noted question of polypharmacy. No evidence of CNS event, infection or metabolic derangement. High probability of dementia.    Care plan was discussed with:  Attempted to call son, [name], and sister Charlyn MinervaRachel Bozeman - no answer and no mailbox.  Thank you for allowing the Palliative Medicine Team to assist in the care of this patient.   Time In: 1219 Time Out: 1231 Total Time 12 min Prolonged Time Billed  no        Illene RegulusMichael Norins, MD, FACP Palliative Care Team  12/27/2014, 12:19 PM  Please contact Palliative Medicine Team phone at [phone] for questions and concerns.

## 2014-12-27 NOTE — Progress Notes (Signed)
Physical Therapy Treatment Patient Details Name: [name] MRN: [id number] DOB: 10/06/1934 Today's Date: 12/27/2014    History of Present Illness 80 y.o. female has a past medical history significant for Alzheimer's dementia, schizophrenia, was being brought to [name] as she has been drowsy, weak and less active for the past few days. Code stroke was activated [name] and patient was transferred to [name] where she was evaluated by neurology. MRI negative for stroke findings.     PT Comments    Patient with decreased participation and minmial verbalizations today. Session focused on static and dynamic sitting balance. Pt very lethargic. Required Max A sitting EOB with intermittent moments of Min A. Frequency updated to 2x/week due to lack of active participation and engagement in therapy. Will continue to follow to maximize independence/ease burden of care.    Follow Up Recommendations  SNF;Supervision/Assistance - 24 hour     Equipment Recommendations  Other (comment)    Recommendations for Other Services       Precautions / Restrictions Precautions Precautions: Fall Restrictions Weight Bearing Restrictions: No    Mobility  Bed Mobility Overal bed mobility: Needs Assistance Bed Mobility: Supine to Sit;Sit to Supine;Rolling Rolling: Max assist   Supine to sit: Max assist;+2 for physical assistance Sit to supine: Max assist;+2 for physical assistance   General bed mobility comments: Assist to bring BLEs to EOB, used chuck pad to shift hips and assisted with elevating trunk. Decreased participation, lethargic.  Transfers                 General transfer comment: Not assessed as pt with poor trunk control/ sitting balance.   Ambulation/Gait                 Stairs            Wheelchair Mobility    Modified Rankin (Stroke Patients Only)       Balance Overall balance  assessment: Needs assistance Sitting-balance support: Feet supported;No upper extremity supported Sitting balance-Leahy Scale: Zero Sitting balance - Comments: Pt with Max A mostly with intermittent moments of Min guard assist to maintain static sitting balance. Performed ADLs- washing face with Mod A and putting lotion on hands. Notable left lateral trunk lean during dynamic sitting activities.  Postural control: Left lateral lean                          Cognition Arousal/Alertness: Awake/alert Behavior During Therapy: Flat affect Overall Cognitive Status: Difficult to assess (Able to state "yes" to name being [name]. Not really communicative.)                      Exercises Other Exercises Other Exercises: Performed forward trunk lean and rocking x5 to assist with abdominal activation and sitting balance.     General Comments        Pertinent Vitals/Pain Pain Assessment: Faces Faces Pain Scale: No hurt    Home Living                      Prior Function            PT Goals (current goals can now be found in the care plan section) Progress towards PT goals: Not progressing toward goals - comment    Frequency  Min 2X/week    PT Plan Current plan remains appropriate;Frequency needs to be updated  Co-evaluation             End of Session Equipment Utilized During Treatment: Gait belt Activity Tolerance: Patient limited by lethargy Patient left: in bed;with call bell/phone within reach;with bed alarm set     Time: 1120-1135 PT Time Calculation (min) (ACUTE ONLY): 15 min  Charges:  $Therapeutic Activity: 8-22 mins                    G Codes:      [name] A [name] 12/27/2014, 12:05 PM  [name], PT, DPT [phone]

## 2014-12-27 NOTE — Progress Notes (Signed)
PROGRESS NOTE  [name] VBT:[id number] DOB: 11-28-1934 DOA: 12/23/2014 PCP: Sande Brothers, MD  HPI/Recap of past 24 hours:  RN reported tachycardia last night. Still lethargic, but attempt to follow commend, no agitation  Assessment/Plan: Active Problems:   Dementia   Schizophrenia   Acute encephalopathy   Pressure ulcer   Palliative care encounter  Acute encephalopathy vs progressive dementia:  UDS negative. Ct head/MRI brain on acute findings. on room air, no hypoxia.  no apparent infection,   hiv/rpr negative, tsh wnl /dapakote level very low /esr wnl /crp mild elevated  change scheduled ativan to bid prn. Stop remeron.( Per chart review, patient was not on these last hospitalization.) Received  ivf 7/9 and 7/10  Neurology seen patient on admission, think metabolic, did not follow patient. Palliative care consult for goal of care. Improving on 7/10 vs fluctuation, puree diet and thin liquid per swallow eval on 7/11. Altered mental status from polypharmacy vs progression of dementia, psych consulted on 7/12 for medication assistance.  Hypokalemia:  replaced,  Mag wnl   Tachycardia? , reviewed tele has one brief episode of sinus tach vs SVT (less than 10s) on 7/10 6pm, will get ckg, consider betablocker.   Sacral decubitus ulcer presented on presentation: woc Wound type: Stage III Pressure ulcer right ischium  Measurement: 3cm x 3cm x 0.2cm  Wound bed: pink, moist, with some fibrin and epithelial buds Drainage (amount, consistency, odor) moderate, serosanguinous, no odor  Periwound: intact  Dressing procedure/placement/frequency: Soft silicone foam dressing to protect and insulate, encourage moist wound healing. Change every 3 days and PRN soilage. Patient is minimally responsive. Turn from side to side at least every two hours.    H/o schizophrenia: continue home meds. No agitation in the hospital, psych consulted for meds assistance  Dementia/FTT:   discussed at great length with patient's son [name] at  [phone] on 7/9, son reported patient's gradual declining 3-51month,  With about 30pounds of weight loss, son also reported patient has caught a cold 5-6 wks ago, has decreased oral intake, less talkative, became more lethargic. Son wish patient to be full code, agree with Ng tube if patient remain unresponsive or continued poor oral intake, son agreed to talk to palliative care team. Appreciate palliative care team input.   Code Status: full  Family Communication: son over the phone on 7/9  Disposition Plan: remain in patient   Consultants:  Neurology  Palliative care   psych  Procedures:  none  Antibiotics:  none   Objective: BP 125/80 mmHg  Pulse 136  Temp(Src) 98.7 F (37.1 C) (Oral)  Resp 16  SpO2 98%  Intake/Output Summary (Last 24 hours) at 12/27/14 1047 Last data filed at 12/27/14 0900  Gross per 24 hour  Intake    363 ml  Output      0 ml  Net    363 ml   There were no vitals filed for this visit.  Exam:   General:  still very lethargic, did attempt to follow command on 7/12  Cardiovascular: RRR  Respiratory: CTABL  Abdomen: Soft/ND/NT, positive BS  Musculoskeletal: No Edema  Neuro: as above  Data Reviewed: Basic Metabolic Panel:  Recent Labs Lab 12/23/14 1450 12/24/14 0310 12/25/14 0450 12/26/14 0440 12/27/14 0350  NA 142 146* 144 145 137  K 4.0 2.5* 2.9* 3.8 4.9  CL 103 106 106 110 104  CO2 '23 25 31 29 26  ' GLUCOSE 105* 75 126* 114* 85  BUN 18 13 <5* 7  10  CREATININE 0.90 0.79 0.80 0.84 0.94  CALCIUM 9.5 8.7* 8.7* 8.5* 8.5*  MG  --  2.0  --  1.7 1.8   Liver Function Tests:  Recent Labs Lab 12/23/14 1450 12/24/14 0310 12/25/14 0450  AST 47* 26 22  ALT 14 11* 10*  ALKPHOS 80 66 66  BILITOT 1.9* 1.6* 1.2  PROT 7.7 5.9* 6.1*  ALBUMIN 3.6 2.8* 2.7*   No results for input(s): LIPASE, AMYLASE in the last 168 hours.  Recent Labs Lab 12/26/14 0440   AMMONIA 26   CBC:  Recent Labs Lab 12/23/14 1450 12/24/14 0310 12/25/14 0450  WBC 10.7* 8.5 7.1  NEUTROABS 8.2*  --   --   HGB 14.1 12.7 13.4  HCT 43.1 38.9 41.2  MCV 93.9 92.2 93.6  PLT 264 253 181   Cardiac Enzymes:   No results for input(s): CKTOTAL, CKMB, CKMBINDEX, TROPONINI in the last 168 hours. BNP (last 3 results) No results for input(s): BNP in the last 8760 hours.  ProBNP (last 3 results) No results for input(s): PROBNP in the last 8760 hours.  CBG:  Recent Labs Lab 12/23/14 1404  GLUCAP 88    Recent Results (from the past 240 hour(s))  Culture, blood (routine x 2)     Status: None (Preliminary result)   Collection Time: 12/25/14  2:37 PM  Result Value Ref Range Status   Specimen Description BLOOD BLOOD RIGHT HAND  Final   Special Requests IN PEDIATRIC BOTTLE 3CC  Final   Culture NO GROWTH 1 DAY  Final   Report Status PENDING  Incomplete  Culture, blood (routine x 2)     Status: None (Preliminary result)   Collection Time: 12/25/14  3:05 PM  Result Value Ref Range Status   Specimen Description BLOOD BLOOD RIGHT HAND  Final   Special Requests BOTTLES DRAWN AEROBIC ONLY 3CC  Final   Culture NO GROWTH 1 DAY  Final   Report Status PENDING  Incomplete     Studies: No results found.  Scheduled Meds: . antiseptic oral rinse  7 mL Mouth Rinse BID  . enoxaparin (LOVENOX) injection  40 mg Subcutaneous Q24H  . feeding supplement (ENSURE ENLIVE)  237 mL Oral BID BM  . olopatadine  1 drop Both Eyes BID  . polyethylene glycol  17 g Oral Daily  . polyvinyl alcohol  1 drop Both Eyes BID  . senna-docusate  2 tablet Oral BID  . sodium chloride  3 mL Intravenous Q12H  . Valproic Acid  250 mg Oral BID  . venlafaxine XR  75 mg Oral Q breakfast  . ziprasidone  80 mg Oral BID WC    Continuous Infusions:     Time spent: 69mns  Gianlucas Evenson MD, PhD  [facility] Pager [phone] If 7PM-7AM, please contact night-coverage at [email or web address], password  [facility]7/05/2015, 10:47 AM  LOS: 4 days

## 2014-12-27 NOTE — Clinical Social Work Note (Signed)
CSW started SNF search process for patient by completing FL2 which is on chart waiting for signature by physician.  PT is recommending SNF for short term rehab.  CSW has not been able to make contact with patient's son, to discuss bed offers, CSW will try again tomorrow.  CSW continuing to follow patient throughout discharge planning.    Ervin KnackEric R. Oleta Gunnoe, MSW, Theresia MajorsLCSWA [phone] 12/27/2014 4:57 PM

## 2014-12-27 NOTE — Consult Note (Addendum)
[place] Psychiatry Consult   Reason for Consult:  Schizophrenia and dementia and medication management Referring Physician:  Dr. Erlinda Hong Patient Identification: [name] MRN:  [id number] Principal Diagnosis: Schizophrenia Diagnosis:   Patient Active Problem List   Diagnosis Date Noted  . Palliative care encounter [Z51.5]   . Pressure ulcer [L89.90] 12/24/2014  . Acute encephalopathy [G93.40] 12/23/2014  . Osteomyelitis [M86.9] 06/16/2013  . Osteomyelitis of foot, acute [M86.179] 06/16/2013  . Dementia [F03.90] 06/16/2013  . Schizophrenia [F20.9] 06/16/2013    Total Time spent with patient: 45 minutes  Subjective:   [name] is a 80 y.o. female patient admitted with drowsy, weak and lethargy.  HPI:  [name] is a 80 y.o. female seen for psych consultation due to possible psych polypharmacy causing her feel more drowsy, generalized weakness and lethargy that she is not able to participate in her routine while staying in [place]. Patient has no other complaints today. She has a past medical history significant for Alzheimer's dementia, schizophrenia, resident of [facility] facility.  During my evaluation, patient is lying in her bed, alert, awake, oriented to her name and being in hospital for being sick. She is poor historian, can not recall her psych providers and medication changes made recently. She has no evidence of depression, anxiety and psychosis. She has no suicide or homicide ideation. Case discussed with Dr. Erlinda Hong and recommend to discontinue her depakote which has no therapeutic benefits and may be contributing to her drowsiness.   HPI Elements:   Location:  psychosis and dementia. Quality:  poor due to increased drowsiness. Severity:  moderate. Timing:  unknown. Duration:  few weeks. Context:  possible polypharmacy and unknown stresses.  Past Medical History:  Past Medical History  Diagnosis Date  . Alzheimer's dementia   .  Schizophrenia   . Tinea pedis   . Bilateral lower extremity edema   . Hypokalemia    History reviewed. No pertinent past surgical history. Family History:  Family History  Problem Relation Age of Onset  . Hypertension Mother   . Hypertension Father    Social History:  History  Alcohol Use No     History  Drug Use No    History   Social History  . Marital Status: Single    Spouse Name: N/A  . Number of Children: N/A  . Years of Education: N/A   Social History Main Topics  . Smoking status: Never Smoker   . Smokeless tobacco: Not on file  . Alcohol Use: No  . Drug Use: No  . Sexual Activity: Not on file   Other Topics Concern  . None   Social History Narrative   Additional Social History:                          Allergies:  No Known Allergies  Labs:  Results for orders placed or performed during the hospital encounter of 12/23/14 (from the past 48 hour(s))  Culture, blood (routine x 2)     Status: None (Preliminary result)   Collection Time: 12/25/14  2:37 PM  Result Value Ref Range   Specimen Description BLOOD BLOOD RIGHT HAND    Special Requests IN PEDIATRIC BOTTLE 3CC    Culture NO GROWTH 1 DAY    Report Status PENDING   Culture, blood (routine x 2)     Status: None (Preliminary result)   Collection Time: 12/25/14  3:05 PM  Result Value  Ref Range   Specimen Description BLOOD BLOOD RIGHT HAND    Special Requests BOTTLES DRAWN AEROBIC ONLY 3CC    Culture NO GROWTH 1 DAY    Report Status PENDING   Ammonia     Status: None   Collection Time: 12/26/14  4:40 AM  Result Value Ref Range   Ammonia 26 9 - 35 umol/L  Basic metabolic panel     Status: Abnormal   Collection Time: 12/26/14  4:40 AM  Result Value Ref Range   Sodium 145 135 - 145 mmol/L   Potassium 3.8 3.5 - 5.1 mmol/L    Comment: DELTA CHECK NOTED   Chloride 110 101 - 111 mmol/L   CO2 29 22 - 32 mmol/L   Glucose, Bld 114 (H) 65 - 99 mg/dL   BUN 7 6 - 20 mg/dL   Creatinine, Ser  0.84 0.44 - 1.00 mg/dL   Calcium 8.5 (L) 8.9 - 10.3 mg/dL   GFR calc non Af Amer >60 >60 mL/min   GFR calc Af Amer >60 >60 mL/min    Comment: (NOTE) The eGFR has been calculated using the CKD EPI equation. This calculation has not been validated in all clinical situations. eGFR's persistently <60 mL/min signify possible Chronic Kidney Disease.    Anion gap 6 5 - 15  Magnesium     Status: None   Collection Time: 12/26/14  4:40 AM  Result Value Ref Range   Magnesium 1.7 1.7 - 2.4 mg/dL  Basic metabolic panel     Status: Abnormal   Collection Time: 12/27/14  3:50 AM  Result Value Ref Range   Sodium 137 135 - 145 mmol/L    Comment: DELTA CHECK NOTED   Potassium 4.9 3.5 - 5.1 mmol/L    Comment: DELTA CHECK NOTED   Chloride 104 101 - 111 mmol/L   CO2 26 22 - 32 mmol/L   Glucose, Bld 85 65 - 99 mg/dL   BUN 10 6 - 20 mg/dL   Creatinine, Ser 0.94 0.44 - 1.00 mg/dL   Calcium 8.5 (L) 8.9 - 10.3 mg/dL   GFR calc non Af Amer 57 (L) >60 mL/min   GFR calc Af Amer >60 >60 mL/min    Comment: (NOTE) The eGFR has been calculated using the CKD EPI equation. This calculation has not been validated in all clinical situations. eGFR's persistently <60 mL/min signify possible Chronic Kidney Disease.    Anion gap 7 5 - 15  Magnesium     Status: None   Collection Time: 12/27/14  3:50 AM  Result Value Ref Range   Magnesium 1.8 1.7 - 2.4 mg/dL    Vitals: Blood pressure 125/80, pulse 136, temperature 98.7 F (37.1 C), temperature source Oral, resp. rate 16, SpO2 98 %.  Risk to Self: Is patient at risk for suicide?: No Risk to Others:   Prior Inpatient Therapy:   Prior Outpatient Therapy:    Current Facility-Administered Medications  Medication Dose Route Frequency Provider Last Rate Last Dose  . acetaminophen (TYLENOL) tablet 500 mg  500 mg Oral Q4H PRN Costin Karlyne Greenspan, MD      . alum & mag hydroxide-simeth (MAALOX/MYLANTA) 200-200-20 MG/5ML suspension 30 mL  30 mL Oral Q6H PRN Costin Karlyne Greenspan, MD      . antiseptic oral rinse (CPC / CETYLPYRIDINIUM CHLORIDE 0.05%) solution 7 mL  7 mL Mouth Rinse BID Costin Karlyne Greenspan, MD   7 mL at 12/27/14 1030  . benzocaine (ORAJEL) 10 % mucosal gel 1  application  1 application Mouth/Throat Q6H PRN Costin Karlyne Greenspan, MD      . enoxaparin (LOVENOX) injection 40 mg  40 mg Subcutaneous Q24H Caren Griffins, MD   40 mg at 12/26/14 2206  . feeding supplement (ENSURE ENLIVE) (ENSURE ENLIVE) liquid 237 mL  237 mL Oral BID BM Maricela Bo Ostheim, RD   237 mL at 12/27/14 1035  . LORazepam (ATIVAN) tablet 0.25 mg  0.25 mg Oral BID PRN Florencia Reasons, MD   0.25 mg at 12/25/14 1726  . olopatadine (PATANOL) 0.1 % ophthalmic solution 1 drop  1 drop Both Eyes BID Caren Griffins, MD   1 drop at 12/27/14 1030  . polyethylene glycol (MIRALAX / GLYCOLAX) packet 17 g  17 g Oral Daily Florencia Reasons, MD   17 g at 12/27/14 1029  . polyvinyl alcohol (LIQUIFILM TEARS) 1.4 % ophthalmic solution 1 drop  1 drop Both Eyes BID Caren Griffins, MD   1 drop at 12/27/14 1030  . senna-docusate (Senokot-S) tablet 2 tablet  2 tablet Oral BID Florencia Reasons, MD   2 tablet at 12/27/14 1029  . sodium chloride (OCEAN) 0.65 % nasal spray 2 spray  2 spray Each Nare Q8H PRN Costin Karlyne Greenspan, MD      . sodium chloride 0.9 % injection 3 mL  3 mL Intravenous Q12H Costin Karlyne Greenspan, MD   3 mL at 12/27/14 1030  . Valproic Acid (DEPAKENE) 250 MG/5ML syrup SYRP 250 mg  250 mg Oral BID Florencia Reasons, MD   250 mg at 12/27/14 1029  . venlafaxine XR (EFFEXOR-XR) 24 hr capsule 75 mg  75 mg Oral Q breakfast Caren Griffins, MD   75 mg at 12/27/14 0807  . ziprasidone (GEODON) capsule 80 mg  80 mg Oral BID WC Caren Griffins, MD   80 mg at 12/27/14 0807    Musculoskeletal: Strength & Muscle Tone: decreased Gait & Station: unable to stand Patient leans: N/A  Psychiatric Specialty Exam: Physical Exam as per history and physical  ROS generalized weakness, paucity of thoughts, denied nausea, vomiting ,SOB and chest pain.   No Fever-chills, No Headache, No changes with Vision or hearing, reports vertigo No problems swallowing food or Liquids, No Chest pain, Cough or Shortness of Breath, No Abdominal pain, No Nausea or Vommitting, Bowel movements are regular, No Blood in stool or Urine, No dysuria, No new skin rashes or bruises, No new joints pains-aches,  No new weakness, tingling, numbness in any extremity, No recent weight gain or loss, No polyuria, polydypsia or polyphagia,   A full 10 point Review of Systems was done, except as stated above, all other Review of Systems were negative.  Blood pressure 125/80, pulse 136, temperature 98.7 F (37.1 C), temperature source Oral, resp. rate 16, SpO2 98 %.There is no weight on file to calculate BMI.  General Appearance: Casual  Eye Contact::  Good  Speech:  Clear and Coherent and Slow  Volume:  Decreased  Mood:  Euthymic  Affect:  Constricted  Thought Process:  Coherent and Goal Directed  Orientation:  Full (Time, Place, and Person)  Thought Content:  WDL  Suicidal Thoughts:  No  Homicidal Thoughts:  No  Memory:  Immediate;   Fair Recent;   Poor  Judgement:  Fair  Insight:  Shallow  Psychomotor Activity:  Decreased  Concentration:  Fair  Recall:  Poor  Fund of Knowledge:Fair  Language: Good  Akathisia:  Negative  Handed:  Right  AIMS (if  indicated):     Assets:  Communication Skills Desire for Improvement Financial Resources/Insurance Housing Leisure Time Resilience Social Support Transportation  ADL's:  Impaired  Cognition: Impaired,  Mild  Sleep:      Medical Decision Making: Review of Psycho-Social Stressors (1), Review or order clinical lab tests (1), Review of Last Therapy Session (1), Review of Medication Regimen & Side Effects (2) and Review of New Medication or Change in Dosage (2)  Treatment Plan Summary: Daily contact with patient to assess and evaluate symptoms and progress in treatment and Medication management  Plan:   Discontinue valproic acid which is nontherapeutic and no value at this time and also will reduce sedation Continue Geodon 80 mg twice daily, review of EKG indicated has normal QTC without prolongation. Patient does not meet criteria for psychiatric inpatient admission. Supportive therapy provided about ongoing stressors.  Appreciate psychiatric consultation and will sign off at this time Please contact [phone] or [phone] if needs further assistance   Disposition: Patient may be discharged back to [facility] when medically cleared.  Dema Timmons,JANARDHAHA R. 12/27/2014 11:31 AM

## 2014-12-27 NOTE — Progress Notes (Signed)
Pt pulse rate was 136, rechecked manually. Bonnie CarpenL. Hooper paged and notified of result. No orders given.

## 2014-12-28 DIAGNOSIS — F209 Schizophrenia, unspecified: Secondary | ICD-10-CM

## 2014-12-28 DIAGNOSIS — G9340 Encephalopathy, unspecified: Principal | ICD-10-CM

## 2014-12-28 DIAGNOSIS — L8990 Pressure ulcer of unspecified site, unspecified stage: Secondary | ICD-10-CM

## 2014-12-28 DIAGNOSIS — F0390 Unspecified dementia without behavioral disturbance: Secondary | ICD-10-CM

## 2014-12-28 LAB — BASIC METABOLIC PANEL
ANION GAP: 9 (ref 5–15)
BUN: 15 mg/dL (ref 6–20)
CALCIUM: 8.5 mg/dL — AB (ref 8.9–10.3)
CHLORIDE: 100 mmol/L — AB (ref 101–111)
CO2: 29 mmol/L (ref 22–32)
Creatinine, Ser: 0.8 mg/dL (ref 0.44–1.00)
GFR calc Af Amer: >60 mL/min (ref >60)
GFR calc non Af Amer: >60 mL/min (ref >60)
Glucose, Bld: 92 mg/dL (ref 65–99)
POTASSIUM: 4.7 mmol/L (ref 3.5–5.1)
SODIUM: 138 mmol/L (ref 135–145)

## 2014-12-28 LAB — MAGNESIUM: Magnesium: 2 mg/dL (ref 1.7–2.4)

## 2014-12-28 NOTE — Progress Notes (Signed)
NP Craige CottaKirby paged and made aware of no IV access and no IV medication.  New orders placed. Sherald BargeSpencer, Kamesha Herne T

## 2014-12-28 NOTE — Clinical Social Work Note (Addendum)
CSW contacted patient's son [name], to discuss SNF placement options for short term rehab.  Patient's son would like patient to to go to a SNF for short term rehab per recommendation by PT.  [facility]'s [facility]  Patient's son stated that she has been doing well for the past few years, until the past 3-4 weeks.  Patient's son stated that she was walking around at ALF before the last month when her health began to deteriorate.  Patient's son feels that patient needs some short term rehab because there is no one available to help take care of her 24/7.  CSW will contact son with bed offers once they have been received.  2:00pm  CSW presented bed offers to patient's son [name], and he would like patient to go to [facility] for short term rehab.  CSW contacted [facility] and they said they can take patient on Thursday if she is medically ready and discharge orders have been received.  CSW will continue to follow patient's discharge planning.   Ervin KnackEric R. Telly Broberg, MSW, Theresia MajorsLCSWA [phone] 12/28/2014 2:11 PM

## 2014-12-28 NOTE — Progress Notes (Signed)
Nutrition Follow-up  DOCUMENTATION CODES:   Not applicable  INTERVENTION:   -Continue Ensure Enlive po BID, each supplement provides 350 kcal and 20 grams of protein  NUTRITION DIAGNOSIS:   Increased nutrient needs related to wound healing as evidenced by estimated needs.  Ongoing  GOAL:   Patient will meet greater than or equal to 90% of their needs  Progressing  MONITOR:   PO intake, Supplement acceptance, Weight trends, Labs  REASON FOR ASSESSMENT:   Malnutrition Screening Tool    ASSESSMENT:   80 y.o. female has a past medical history significant for Alzheimer's dementia, schizophrenia, [facility] as she has been drowsy, weak and less active for the past few days. Code stroke was activated [facility] and patient was transferred [facility] where she was evaluated by neurology.  Pt very lethargic at time of visit. She would arouse briefly when exam was being performed, but would quickly fall back asleep. Pt did not stay alert enough to respond to questions.   Intake remains poor; noted 5-50% meal completion. Pt is consuming Ensure supplements; noted 90% of supplement consumed in styrofoam cup at bedside.   Nutrition-Focused physical exam completed. Findings are mild fat depletion, mild muscle depletion, and mild edema.   CSW and palliative care following; both have been trying to reach family members to determine discharge disposition and GOC.    Diet Order:  DIET - DYS 1 Room service appropriate?: No; Fluid consistency:: Thin  Skin:  Wound (see comment) (Stage 3 R buttocks pressure ulcer)  Last BM:  12/27/14  Height:   Ht Readings from Last 1 Encounters:  06/16/13 4\' 9"  (1.448 m)    Weight:   Wt Readings from Last 1 Encounters:  12/28/14 195 lb 12.3 oz (88.8 kg)    Ideal Body Weight:     Wt Readings from Last 10 Encounters:  12/28/14 195 lb 12.3 oz (88.8 kg)  06/16/13  195 lb 12.3 oz (88.8 kg)    BMI:  Body mass index is 42.35 kg/(m^2).  Estimated Nutritional Needs:   Kcal:  1950-2150  Protein:  90-105 grams  Fluid:  1.9-2.1 L  EDUCATION NEEDS:   No education needs identified at this time  Semone Orlov A. Mayford KnifeWilliams, RD, LDN, CDE Pager: [phone] After hours Pager: [phone]

## 2014-12-28 NOTE — Clinical Social Work Note (Signed)
Clinical Social Work Assessment  Patient Details  Name: [name] MRN: [id number] Date of Birth: 1934/06/21  Date of referral:  12/27/14               Reason for consult:  Facility Placement                Permission sought to share information with:  Family Supports Permission granted to share information::   (Patient has dementia, her sister is HCPOA and son are the main contact for patient.)  Name::     Bonnie PrudeMichael Hooper, patient's son, and Bonnie NordmannRachael Hooper patient's sister.  Agency::  SNF admissions  Relationship::     Contact Information:     Housing/Transportation Living arrangements for the past 2 months:  Assisted Living Facility Source of Information:  Adult Children Patient Interpreter Needed:  None Criminal Activity/Legal Involvement Pertinent to Current Situation/Hospitalization:  No - Comment as needed Significant Relationships:  Adult Children, Siblings Lives with:  Facility Resident Do you feel safe going back to the place where you live?  No (Patient's family want's her to have some rehab first before returning.) Need for family participation in patient care:  Yes (Comment) (Patient has dementia and was not able to make decisions.)  Care giving concerns:  Patient's son Bonnie NeedleMichael is concerned that patient needs more care to be provided temporarily then what she is able to get at the [facility].  Patient's son would like her to go to SNF for short term rehab   Social Worker assessment / plan:  Patient is a 80 year old female who is [facility].  Patient has dementia, and is only oriented to person, CSW spoke with patient's son Bonnie NeedleMichael to complete assessment for patient.  Patient's son states that she was doing well at the ALF until about a month ago when patient was not talking or eating as much as usual.  Patient's son stated she has not been as active as she usually is and also has been much more lethargic.  Patient's son stated that she was in  [facility] for rehab in the past and she did well while she was there.  Patient's son states he would like her to return back there for short term rehab, and will possibly consider long term care.  Patient's son and sister are involved in her care and decision making process.  Patient's family are in agreement to having her go to SNF for short term rehab, and then possibly becoming long term care verse returning back to ALF.  Patient's family will discuss possible long term care with facility.  Employment status:  Retired Health and safety inspectornsurance information:  Medicare PT Recommendations:  Skilled Nursing Facility Information / Referral to community resources:     Patient/Family's Response to care: Patient's family in agreement to going to SNF for short term rehab.  Patient/Family's Understanding of and Emotional Response to Diagnosis, Current Treatment, and Prognosis:  Patient's family are aware that patient needs some therapy, and are hopeful that once she starts eating more she will improve.  Emotional Assessment Appearance:  Appears stated age Attitude/Demeanor/Rapport:  Unable to Assess Affect (typically observed):  Unable to Assess Orientation:  Oriented to Self Alcohol / Substance use:  Not Applicable Psych involvement (Current and /or in the community):  No (Comment)  Discharge Needs  Concerns to be addressed:  Lack of Support Readmission within the last 30 days:  No Current discharge risk:  Cognitively Impaired Barriers to  Discharge:  No Barriers Identified   [place] Constable 12/28/2014, 1:37 PM

## 2014-12-28 NOTE — Progress Notes (Signed)
[facility] PROGRESS NOTE  Cleotis NipperStella M Zimmermann ZOX:[id number] DOB: 1935-05-15 DOA: 12/23/2014  PCP: Ron ParkerBOWEN,SAMUEL, MD  Brief HPI: 80 year old African-American female with a past medical history of Alzheimer's dementia, schizophrenia was brought in from her assisted living facility for being drowsy, weak, and with decreased level of activity. Initial suspicion was for stroke, but was not felt to be the case once evaluated by neurology. She was admitted for further evaluation.  Past medical history:  Past Medical History  Diagnosis Date  . Alzheimer's dementia   . Schizophrenia   . Tinea pedis   . Bilateral lower extremity edema   . Hypokalemia     Consultants: Neurology, palliative medicine  Procedures: None  Antibiotics: None  Subjective: Patient confused, which I suspect is her baseline. Denies any complaints.  Objective: Vital Signs  Filed Vitals:   12/27/14 0610 12/27/14 1400 12/27/14 2150 12/28/14 0516  BP: 125/80 106/57 120/75 121/67  Pulse: 136 88 107 94  Temp: 98.7 F (37.1 C) 98.2 F (36.8 C) 98.4 F (36.9 C) 98.8 F (37.1 C)  TempSrc: Oral Oral Oral Oral  Resp: 16 18 18 16   SpO2: 98% 100% 100% 97%    Intake/Output Summary (Last 24 hours) at 12/28/14 1026 Last data filed at 12/28/14 0800  Gross per 24 hour  Intake    123 ml  Output      0 ml  Net    123 ml   There were no vitals filed for this visit.  General appearance: alert, distracted and no distress Resp: clear to auscultation bilaterally Cardio: regular rate and rhythm, S1, S2 normal, no murmur, click, rub or gallop GI: soft, non-tender; bowel sounds normal; no masses,  no organomegaly Extremities: extremities normal, atraumatic, no cyanosis or edema Neurologic: Alert. Distracted. Disoriented. No focal deficits.  Lab Results:  Basic Metabolic Panel:  Recent Labs Lab 12/24/14 0310 12/25/14 0450 12/26/14 0440 12/27/14 0350 12/28/14 0330  NA 146* 144 145 137 138  K 2.5* 2.9* 3.8  4.9 4.7  CL 106 106 110 104 100*  CO2 25 31 29 26 29   GLUCOSE 75 126* 114* 85 92  BUN 13 <5* 7 10 15   CREATININE 0.79 0.80 0.84 0.94 0.80  CALCIUM 8.7* 8.7* 8.5* 8.5* 8.5*  MG 2.0  --  1.7 1.8 2.0   Liver Function Tests:  Recent Labs Lab 12/23/14 1450 12/24/14 0310 12/25/14 0450  AST 47* 26 22  ALT 14 11* 10*  ALKPHOS 80 66 66  BILITOT 1.9* 1.6* 1.2  PROT 7.7 5.9* 6.1*  ALBUMIN 3.6 2.8* 2.7*    Recent Labs Lab 12/26/14 0440  AMMONIA 26   CBC:  Recent Labs Lab 12/23/14 1450 12/24/14 0310 12/25/14 0450  WBC 10.7* 8.5 7.1  NEUTROABS 8.2*  --   --   HGB 14.1 12.7 13.4  HCT 43.1 38.9 41.2  MCV 93.9 92.2 93.6  PLT 264 253 181   CBG:  Recent Labs Lab 12/23/14 1404  GLUCAP 88    Recent Results (from the past 240 hour(s))  Culture, blood (routine x 2)     Status: None (Preliminary result)   Collection Time: 12/25/14  2:37 PM  Result Value Ref Range Status   Specimen Description BLOOD BLOOD RIGHT HAND  Final   Special Requests IN PEDIATRIC BOTTLE 3CC  Final   Culture NO GROWTH 2 DAYS  Final   Report Status PENDING  Incomplete  Culture, blood (routine x 2)     Status: None (Preliminary result)  Collection Time: 12/25/14  3:05 PM  Result Value Ref Range Status   Specimen Description BLOOD BLOOD RIGHT HAND  Final   Special Requests BOTTLES DRAWN AEROBIC ONLY 3CC  Final   Culture NO GROWTH 2 DAYS  Final   Report Status PENDING  Incomplete      Studies/Results: No results found.  Medications:  Scheduled: . antiseptic oral rinse  7 mL Mouth Rinse BID  . enoxaparin (LOVENOX) injection  40 mg Subcutaneous Q24H  . feeding supplement (ENSURE ENLIVE)  237 mL Oral BID BM  . olopatadine  1 drop Both Eyes BID  . polyethylene glycol  17 g Oral Daily  . polyvinyl alcohol  1 drop Both Eyes BID  . senna-docusate  2 tablet Oral BID  . sodium chloride  3 mL Intravenous Q12H  . venlafaxine XR  75 mg Oral Q breakfast  . ziprasidone  80 mg Oral BID WC    Continuous:  WUJ:[id number], alum & mag hydroxide-simeth, benzocaine, LORazepam, sodium chloride  Assessment/Plan:  Principal Problem:   Schizophrenia Active Problems:   Dementia   Acute encephalopathy   Pressure ulcer   Palliative care encounter    Acute encephalopathy versus progressive dementia No reversible etiology was found. MRI brain was negative for acute event. UA did not suggest any infection. HIV and RPR were negative. TSH normal. Patient was seen by neurology who thought this was metabolic. Concern is also about progressive dementia. Patient seen by palliative medicine. Discussions were held. At this time family wants to proceed with medical management. I don't think her mental status will improve any further.  History of schizophrenia Continue current medications.  Sacral decubitus ulcer Stage III noted in the right ischium. Wound care following.  History of dementia/failure to thrive Discussions have been held on numerous occasions with the patient's son. He did report a decline in the patient's condition over the last 3-4 months with weight loss. However, he appears to have poor insight into the patient's health issues. Will recommend continuing further dialog at the skilled nursing facility with palliative medicine.   DVT Prophylaxis: Lovenox    Code Status: Full code  Family Communication: No family at bedside  Disposition Plan: Physical therapy is recommending skilled nursing facility. Social worker is aware.     LOS: 5 days   [facility]  [facility] Pager [phone] 12/28/2014, 10:26 AM  If 7PM-7AM, please contact night-coverage at [email or web address], password [facility]

## 2014-12-29 MED ORDER — LORAZEPAM 0.5 MG PO TABS: 0.2500 mg | ORAL_TABLET | Freq: Two times a day (BID) | ORAL | Status: AC | PRN

## 2014-12-29 MED ORDER — ENSURE ENLIVE PO LIQD: 237.0000 mL | Freq: Three times a day (TID) | ORAL | Status: DC

## 2014-12-29 MED ORDER — ENSURE ENLIVE PO LIQD: 237.0000 mL | Freq: Three times a day (TID) | ORAL | Status: AC

## 2014-12-29 MED ORDER — SENNOSIDES-DOCUSATE SODIUM 8.6-50 MG PO TABS: 2.0000 | ORAL_TABLET | Freq: Two times a day (BID) | ORAL | Status: AC

## 2014-12-29 NOTE — Progress Notes (Signed)
Upon entering pt's room, I asked the pt again if she felt like she could pee and she said yes this time.  We put her on the bedpan and pt voided 400cc. Sherald BargeSpencer, Catha Ontko T

## 2014-12-29 NOTE — Care Management Note (Signed)
Case Management Note  Patient Details  Name: Cleotis NipperStella M Merkin MRN: [id number] Date of Birth: [birth year]  Subjective/Objective:       Schizophrenia, weakness             Action/Plan:  SNF  Expected Discharge Date:  12/29/2014               Expected Discharge Plan:  Skilled Nursing Facility  In-House Referral:  Clinical Social Work  Discharge planning Services  CM Consult   Status of Service:  Completed, signed off  Medicare Important Message Given:  Yes-second notification given Date Medicare IM Given:    Medicare IM give by:    Date Additional Medicare IM Given:    Additional Medicare Important Message give by:     If discussed at Long Length of Stay Meetings, dates discussed:    Additional Comments: Chart reviewed. Planned dc to SNF when medically stable.  Elliot CousinShavis, Jakeel Starliper Ellen, RN 12/29/2014, 10:48 AM

## 2014-12-29 NOTE — Progress Notes (Signed)
Bonnie NipperStella M Hooper to be D/C'd Skilled nursing facility: [facility] per MD order.  Discussed with the patient and all questions fully answered.  VSS, Skin breakdown noted to right upper buttocks and ischial tuberosity.  Pink foams in place.  SNF packet prepared by Child psychotherapistsocial worker and sent with patient to facility. Packet includes prescription. Family aware of transfer to facility. [facility].  Patient escorted via stretcher, and D/C [facility].   Burt EkCook, Scot Shiraishi D 12/29/2014 1:42 PM

## 2014-12-29 NOTE — Care Management Important Message (Signed)
Important Message  Patient Details  Name: [name] MRN: [id number] Date of Birth: 10-19-1934   Medicare Important Message Given:  Yes-third notification given    Orson AloeMegan P Jeliyah Middlebrooks 12/29/2014, 1:58 PM

## 2014-12-29 NOTE — Discharge Summary (Signed)
[facility]  Physician Discharge Summary   Patient ID: [name] MRN: [id number] DOB/AGE: 1934/07/26 80 y.o.  Admit date: 12/23/2014 Discharge date: 12/29/2014  PCP: Ron ParkerBOWEN,SAMUEL, MD  DISCHARGE DIAGNOSES:  Principal Problem:   Schizophrenia Active Problems:   Dementia   Acute encephalopathy   Pressure ulcer   Palliative care encounter   RECOMMENDATIONS FOR OUTPATIENT FOLLOW UP: 1. Recommend that conversation regarding goals of care continue at the skilled nursing facility. 2. Ongoing decubitus care at the skilled nursing facility   DISCHARGE CONDITION: fair  Diet recommendation: Dysphagia 1 diet with thin liquids. Full aspiration precautions.  Filed Weights   12/28/14 0928  Weight: 88.8 kg (195 lb 12.3 oz)    INITIAL HISTORY: 80 year old African-American female with a past medical history of Alzheimer's dementia, schizophrenia was brought in from her assisted living facility for being drowsy, weak, and with decreased level of activity. Initial suspicion was for stroke, but was not felt to be the case once evaluated by neurology. She was admitted for further evaluation.  Consultations: Neurology, palliative medicine   HOSPITAL COURSE:   Acute encephalopathy versus progressive dementia No reversible etiology was found. MRI brain was negative for acute event. UA did not suggest any infection. HIV and RPR were negative. TSH normal. Patient was seen by neurology who thought this was metabolic. Concern is also about progressive dementia. Patient seen by palliative medicine. Discussions were held. At this time family wants to proceed with medical management. I don't think her mental status will improve any further. I had a long discussion with the patient's son yesterday. I told him that in all likelihood patient's mental status will continue to decline overall over time. He understands. He is amenable to having further discussions with palliative medicine  care.  History of schizophrenia Patient was seen by psychiatry. Her Depakote was discontinued. Continue current medications as outlined below.  Sacral decubitus ulcer Stage III noted in the right ischium. Wound care has been following. Decubitus care at the skilled nursing facility.  History of dementia/failure to thrive Discussions have been held on numerous occasions with the patient's son. He did report a decline in the patient's condition over the last 3-4 months with weight loss. However, he appears to have poor insight into the patient's health issues. Will recommend continuing further dialog at the skilled nursing facility with palliative medicine.  Overall, patient remains stable. Her oral intake remains poor. Would recommend ensure 3 times a day. She will need encouragement to eat and drink. Considering all of her comorbidities, she is not a candidate for artificial nutrition. Okay for discharge to skilled nursing facility today.    PERTINENT LABS:  The results of significant diagnostics from this hospitalization (including imaging, microbiology, ancillary and laboratory) are listed below for reference.    Microbiology: Recent Results (from the past 240 hour(s))  Culture, blood (routine x 2)     Status: None (Preliminary result)   Collection Time: 12/25/14  2:37 PM  Result Value Ref Range Status   Specimen Description BLOOD BLOOD RIGHT HAND  Final   Special Requests IN PEDIATRIC BOTTLE 3CC  Final   Culture NO GROWTH 3 DAYS  Final   Report Status PENDING  Incomplete  Culture, blood (routine x 2)     Status: None (Preliminary result)   Collection Time: 12/25/14  3:05 PM  Result Value Ref Range Status   Specimen Description BLOOD BLOOD RIGHT HAND  Final   Special Requests BOTTLES DRAWN AEROBIC ONLY 3CC  Final  Culture NO GROWTH 3 DAYS  Final   Report Status PENDING  Incomplete     Labs: Basic Metabolic Panel:  Recent Labs Lab 12/24/14 0310 12/25/14 0450  12/26/14 0440 12/27/14 0350 12/28/14 0330  NA 146* 144 145 137 138  K 2.5* 2.9* 3.8 4.9 4.7  CL 106 106 110 104 100*  CO2 GLUCOSE 75 126* 114* 85 92  BUN 13 <5* CREATININE 0.79 0.80 0.84 0.94 0.80  CALCIUM 8.7* 8.7* 8.5* 8.5* 8.5*  MG 2.0  --  1.7 1.8 2.0   Liver Function Tests:  Recent Labs Lab 12/23/14 1450 12/24/14 0310 12/25/14 0450  AST 47* 26 22  ALT 14 11* 10*  ALKPHOS 80 66 66  BILITOT 1.9* 1.6* 1.2  PROT 7.7 5.9* 6.1*  ALBUMIN 3.6 2.8* 2.7*    Recent Labs Lab 12/26/14 0440  AMMONIA 26   CBC:  Recent Labs Lab 12/23/14 1450 12/24/14 0310 12/25/14 0450  WBC 10.7* 8.5 7.1  NEUTROABS 8.2*  --   --   HGB 14.1 12.7 13.4  HCT 43.1 38.9 41.2  MCV 93.9 92.2 93.6  PLT 264 253 181   CBG:  Recent Labs Lab 12/23/14 1404  GLUCAP 88     IMAGING STUDIES Dg Chest 2 View  12/23/2014   CLINICAL DATA:  Altered mental status.  EXAM: CHEST  2 VIEW  COMPARISON:  None.  FINDINGS: Lungs are clear. Heart size is normal. The aorta is tortuous. No pneumothorax or pleural effusion.  IMPRESSION: No acute disease.   Electronically Signed   By: Drusilla Kanner M.D.   On: 12/23/2014 15:02   Ct Head Wo Contrast  12/23/2014   CLINICAL DATA:  Altered mental status.  EXAM: CT HEAD WITHOUT CONTRAST  TECHNIQUE: Contiguous axial images were obtained from the base of the skull through the vertex without intravenous contrast.  COMPARISON:  CT scan of March 28, 2013.  FINDINGS: Bony calvarium appears intact. Left ethmoid sinusitis is noted. Mild diffuse cortical atrophy is noted. Mild chronic ischemic white matter disease is noted. No mass effect or midline shift is noted. Ventricular size is within normal limits. There is no evidence of mass lesion, hemorrhage or acute infarction.  IMPRESSION: Mild diffuse cortical atrophy. Mild chronic ischemic white matter disease. Mild left ethmoid sinusitis. No acute intracranial abnormality seen.   Electronically Signed    By: Lupita Raider, M.D.   On: 12/23/2014 15:11   Mr Brain Wo Contrast  12/23/2014   CLINICAL DATA:  Acute encephalopathy  EXAM: MRI HEAD WITHOUT CONTRAST  TECHNIQUE: Multiplanar, multiecho pulse sequences of the brain and surrounding structures were obtained without intravenous contrast.  COMPARISON:  CT head 12/23/2014  FINDINGS: Image quality degraded by mild to moderate motion  Moderate atrophy.  Negative for hydrocephalus  Negative for acute infarct. Mild to moderate chronic microvascular ischemic change in the white matter. Brainstem and cerebellum intact.  Negative for hemorrhage or mass  Paranasal sinuses reveal mild mucosal edema left ethmoid sinus. Normal orbit. Pituitary not enlarged.  IMPRESSION: Image quality degraded by motion  Generalized atrophy with chronic microvascular ischemia. No acute intracranial abnormality.   Electronically Signed   By: Marlan Palau M.D.   On: 12/23/2014 18:44    DISCHARGE EXAMINATION: Filed Vitals:   12/28/14 0928 12/28/14 1300 12/28/14 2103 12/29/14 0503  BP:  123/74 125/68 105/72  Pulse:  82 87 99  Temp:  98.2 F (36.8 C) 98.5 F (36.9  C) 97.7 F (36.5 C)  TempSrc:  Axillary Oral Oral  Resp:  16 18 16   Weight: 88.8 kg (195 lb 12.3 oz)     SpO2:  99% 99% 98%   General appearance: alert, distracted and no distress Resp: clear to auscultation bilaterally Cardio: regular rate and rhythm, S1, S2 normal, no murmur, click, rub or gallop GI: soft, non-tender; bowel sounds normal; no masses,  no organomegaly Neurologic: Alert. remains disoriented. No obvious focal deficits.  DISPOSITION: SNF  Discharge Instructions    Discharge diet:    Complete by:  As directed   Dysphagia 1 Diet     Discharge instructions    Complete by:  As directed   Recommend palliative medicine follow up at SNF for ongoing discussions with family regarding goals of care.  You were cared for by a hospitalist during your hospital stay. If you have any questions about your  discharge medications or the care you received while you were in the hospital after you are discharged, you can call the unit and asked to speak with the hospitalist on call if the hospitalist that took care of you is not available. Once you are discharged, your primary care physician will handle any further medical issues. Please note that NO REFILLS for any discharge medications will be authorized once you are discharged, as it is imperative that you return to your primary care physician (or establish a relationship with a primary care physician if you do not have one) for your aftercare needs so that they can reassess your need for medications and monitor your lab values. If you do not have a primary care physician, you can call [phone] for a physician referral.     Increase activity slowly    Complete by:  As directed            ALLERGIES: No Known Allergies   Current Discharge Medication List    START taking these medications   Details  feeding supplement, ENSURE ENLIVE, (ENSURE ENLIVE) LIQD Take 237 mLs by mouth 3 (three) times daily between meals. Qty: 237 mL, Refills: 12    senna-docusate (SENOKOT-S) 8.6-50 MG per tablet Take 2 tablets by mouth 2 (two) times daily.      CONTINUE these medications which have CHANGED   Details  LORazepam (ATIVAN) 0.5 MG tablet Take 0.5 tablets (0.25 mg total) by mouth 2 (two) times daily as needed for anxiety. Qty: 30 tablet, Refills: 0      CONTINUE these medications which have NOT CHANGED   Details  benzocaine (ORAJEL) 10 % mucosal gel Use as directed 1 application in the mouth or throat every 6 (six) hours as needed for mouth pain.    cetirizine (ZYRTEC) 10 MG tablet Take 10 mg by mouth at bedtime.    hydrocortisone cream 0.5 % Apply 1 application topically 2 (two) times daily as needed for itching.    Melatonin 3 MG TABS Take 1 tablet by mouth at bedtime.    Olopatadine HCl 0.2 % SOLN Place 1 drop into both eyes daily.    polyvinyl  alcohol (LIQUIFILM TEARS) 1.4 % ophthalmic solution Place 1 drop into both eyes 2 (two) times daily.    sodium chloride (OCEAN) 0.65 % SOLN nasal spray Place 2 sprays into both nostrils every 8 (eight) hours as needed for congestion.    venlafaxine XR (EFFEXOR-XR) 75 MG 24 hr capsule Take 75 mg by mouth daily with breakfast.    ziprasidone (GEODON) 80 MG capsule Take 1  capsule (80 mg total) by mouth 2 (two) times daily with a meal. Qty: 60 capsule, Refills: 0    acetaminophen (TYLENOL) 500 MG tablet Take 500 mg by mouth every 4 (four) hours as needed (fever/pain).    alum & mag hydroxide-simeth (MAALOX/MYLANTA) 200-200-20 MG/5ML suspension Take 30 mLs by mouth every 6 (six) hours as needed for indigestion or heartburn.    guaiFENesin (ROBITUSSIN) 100 MG/5ML SOLN Take 10 mLs by mouth every 6 (six) hours as needed for cough or to loosen phlegm (not to exceed 4 doses).    loperamide (IMODIUM A-D) 2 MG tablet Take 2 mg by mouth 4 (four) times daily as needed for diarrhea or loose stools.    magnesium hydroxide (MILK OF MAGNESIA) 400 MG/5ML suspension Take 30 mLs by mouth at bedtime as needed for mild constipation.      STOP taking these medications     divalproex (DEPAKOTE ER) 500 MG 24 hr tablet      mirtazapine (REMERON) 7.5 MG tablet      HYDROcodone-acetaminophen (NORCO/VICODIN) 5-325 MG per tablet      ondansetron (ZOFRAN) 4 MG tablet      zolpidem (AMBIEN) 5 MG tablet        Follow-up Information    Follow up with [facility], MD. Schedule an appointment as soon as possible for a visit in 2 weeks.   Specialty:  Internal Medicine   Why:  post hospitalization follow up      TOTAL DISCHARGE TIME: 35 minutes  [facility]  [facility] Pager [phone]  12/29/2014, 12:09 PM

## 2014-12-29 NOTE — Clinical Social Work Note (Signed)
Patient to be d/c'ed today to [facility].  Patient and family agreeable to plans will transport via ems RN to call report.  CSW notified patient's son [name] who is aware of patient's discharge today.  Windell MouldingEric Jasminemarie Sherrard, MSW, Theresia MajorsLCSWA [phone]

## 2014-12-29 NOTE — Progress Notes (Signed)
Pt, who is normally incontinent has not urinated all night according to NT.  Bladder scan reveals 800cc urine.  On call NP paged. Sherald BargeSpencer, Shameria Trimarco T

## 2014-12-30 LAB — CULTURE, BLOOD (ROUTINE X 2)
CULTURE: NO GROWTH
CULTURE: NO GROWTH

## 2015-02-08 ENCOUNTER — Emergency Department
Admission: EM | Admit: 2015-02-08 | Discharge: 2015-02-08 | Disposition: A | Discharge status: Home / Self Care | Payer: Medicare Other

## 2015-06-18 DEATH — deceased

## 2016-01-13 IMAGING — MR MR HEAD W/O CM
9 of 10 series · 40 of 48 positions shown · non-contrast
Comparison: CT head 12/23/2014

CLINICAL DATA: Acute encephalopathy

EXAM:
MRI HEAD WITHOUT CONTRAST
TECHNIQUE: Multiplanar, multiecho pulse sequences of the brain and surrounding
structures were obtained without intravenous contrast.

[Series 3: DWI · axial · 3.0mm · 1.09mm/px · z∈[-64,+73]mm · 11 of 94 slices shown (1 of 4)]
[im 1/94]
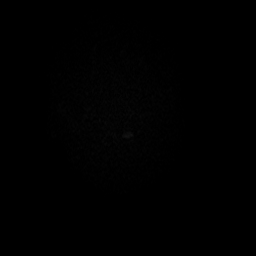
[im 10/94]
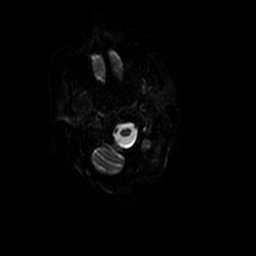
[im 19/94]
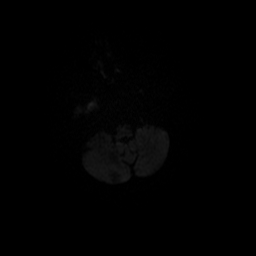
[im 28/94]
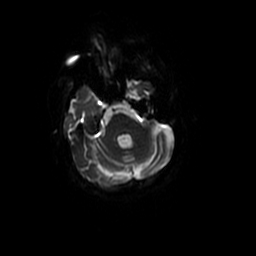
[im 38/94]
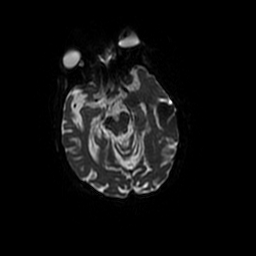
[im 47/94]
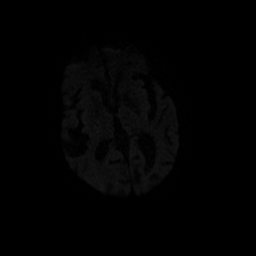
[im 56/94]
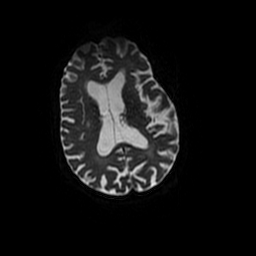
[im 66/94]
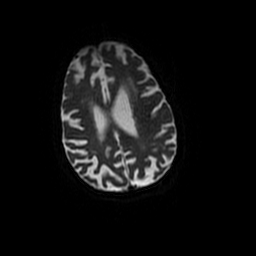
[im 75/94]
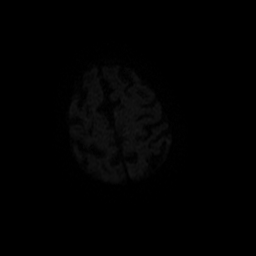
[im 84/94]
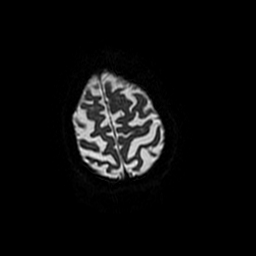
[im 94/94]
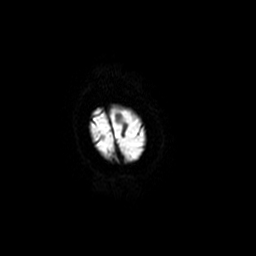

[Series 5: T2 · axial · 5.0mm · 0.43mm/px · z∈[-78,+56]mm · 3 of 24 slices shown (1 of 2)]
[im 1/24]
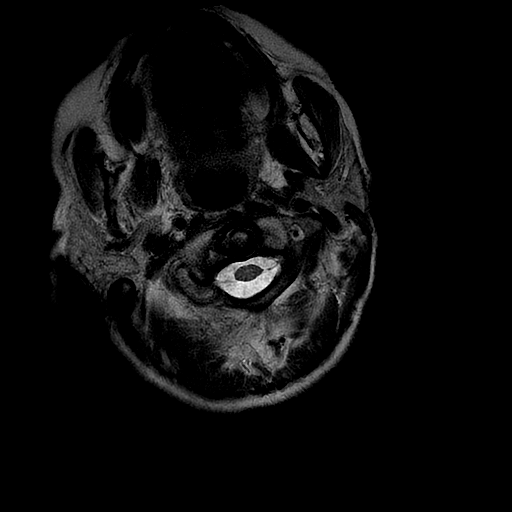
[im 12/24]
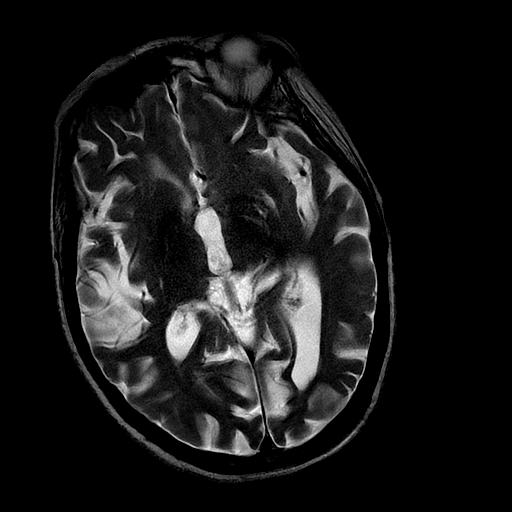
[im 24/24]
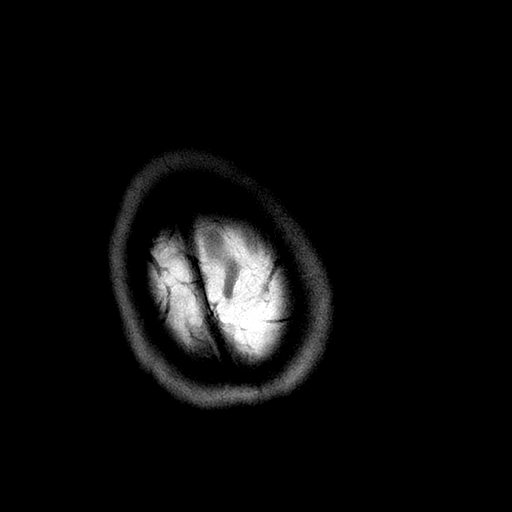

[Series 7: FLAIR · sagittal · 5.0mm · 0.94mm/px · 3 of 22 slices shown (1 of 2)]
[im 1/22]
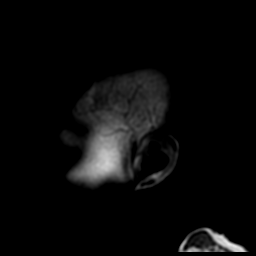
[im 11/22]
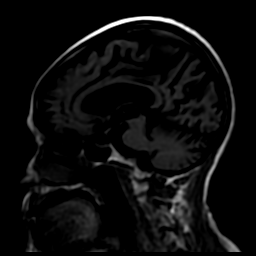
[im 22/22]
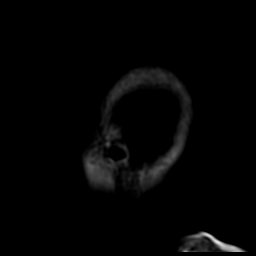

[Series 8: FLAIR · axial · 5.0mm · 0.43mm/px · z∈[-78,+56]mm · 3 of 24 slices shown (2 of 2)]
[im 1/24]
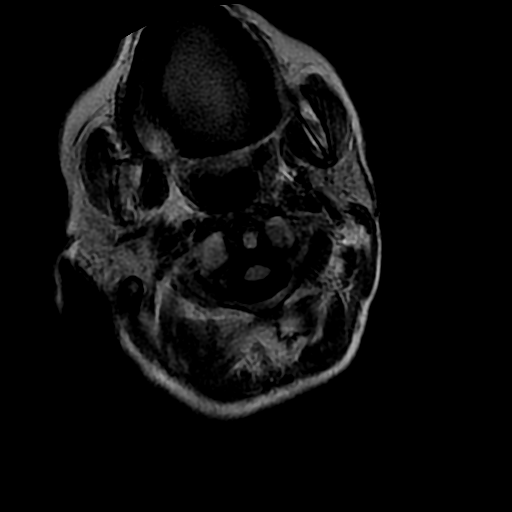
[im 12/24]
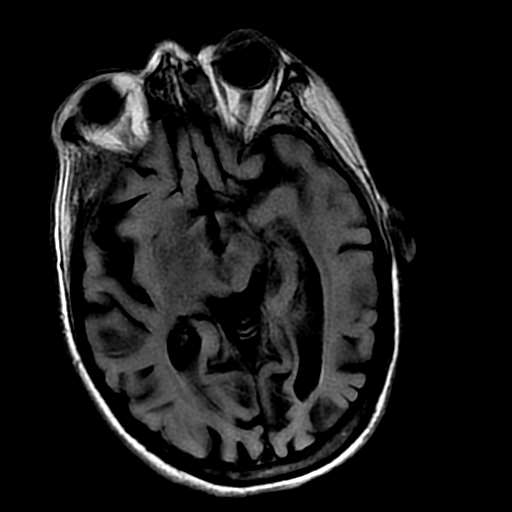
[im 24/24]
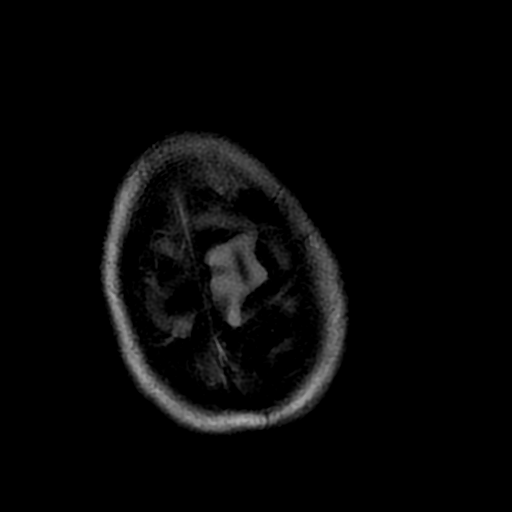

[Series 9: DWI · coronal · 5.0mm · 0.86mm/px · 7 of 56 slices shown (2 of 4)]
[im 1/56]
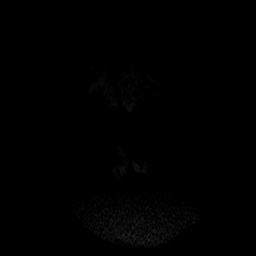
[im 10/56]
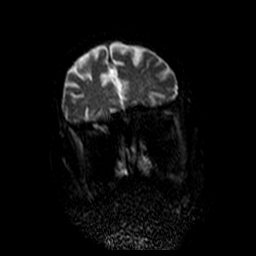
[im 19/56]
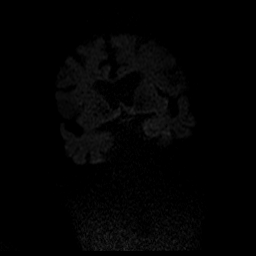
[im 28/56]
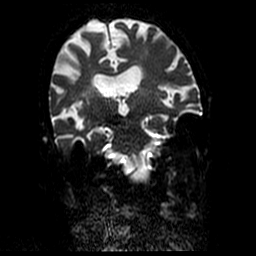
[im 37/56]
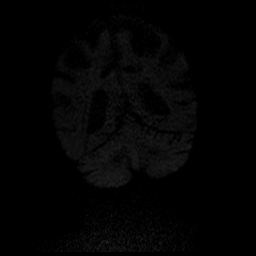
[im 46/56]
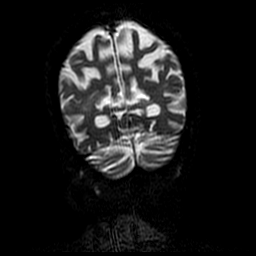
[im 56/56]
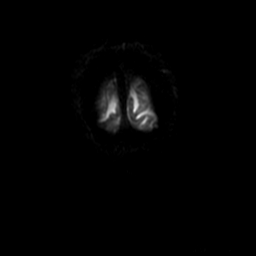

[Series 11: T2 · coronal · 5.0mm · 0.43mm/px · 3 of 28 slices shown (2 of 2)]
[im 1/28]
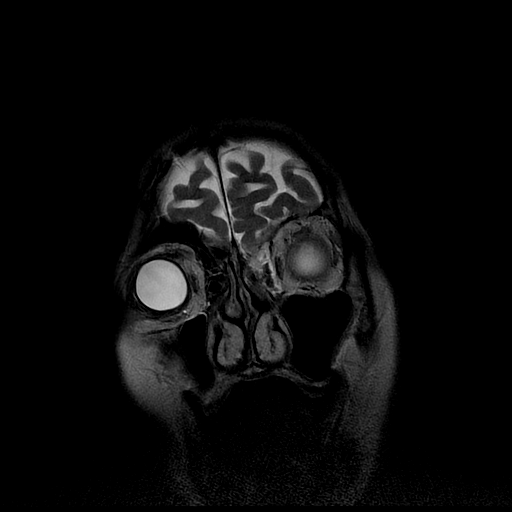
[im 14/28]
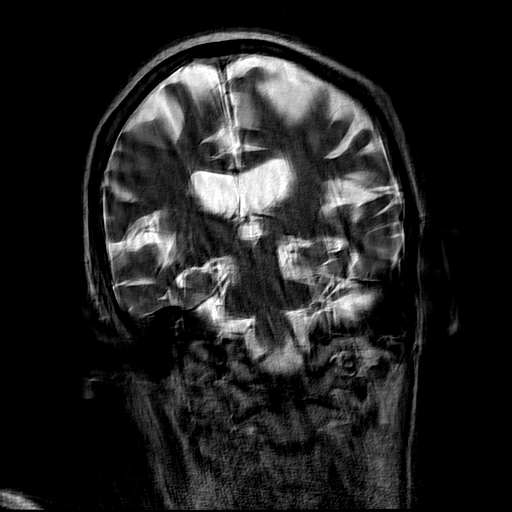
[im 28/28]
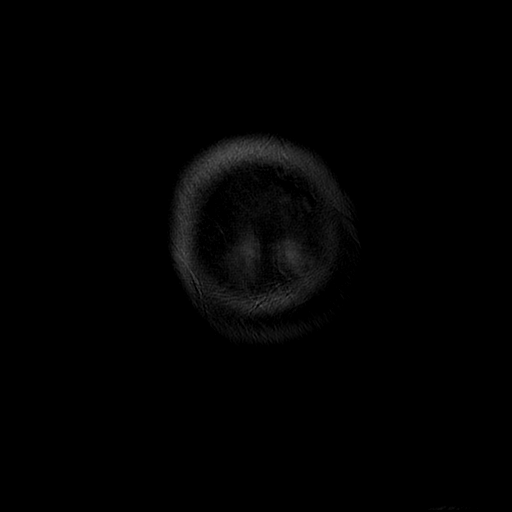

[Series 12: ax mpgr · axial · 5.0mm · 0.43mm/px · 1 of 24 slices shown]
[im 1/24]
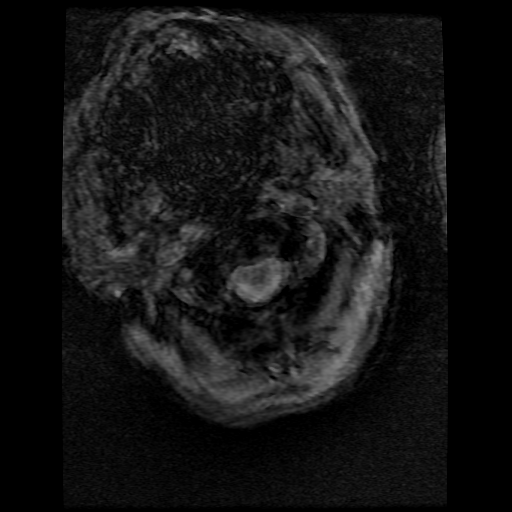

[Series 300: DWI · axial · 3.0mm · 1.09mm/px · z∈[-64,+73]mm · 6 of 47 slices shown (3 of 4)]
[im 1/47]
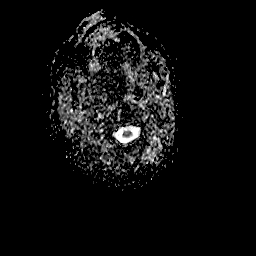
[im 10/47]
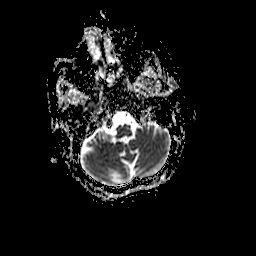
[im 19/47]
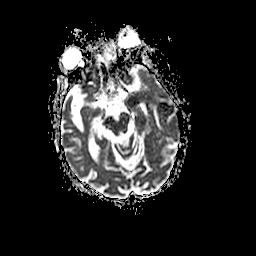
[im 28/47]
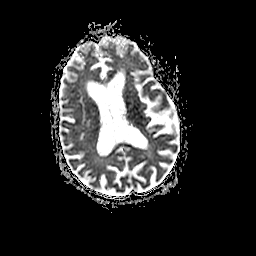
[im 37/47]
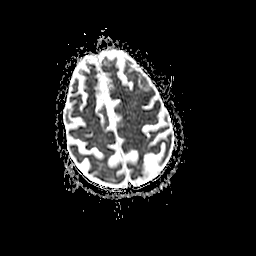
[im 47/47]
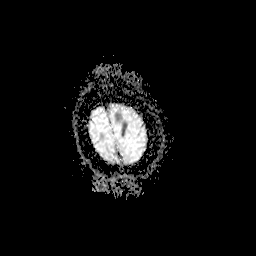

[Series 900: DWI · coronal · 5.0mm · 0.86mm/px · 3 of 28 slices shown (4 of 4)]
[im 1/28]
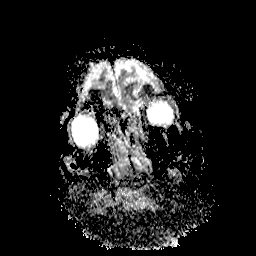
[im 14/28]
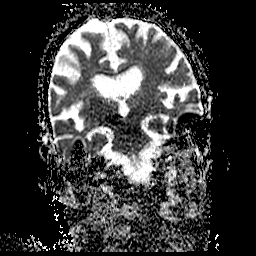
[im 28/28]
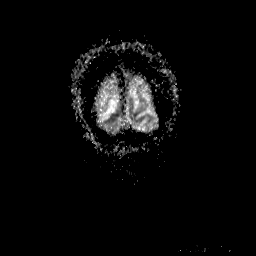

[40 of 48 positions shown; findings below may reference images not displayed]

FINDINGS: Image quality degraded by mild to moderate motion

Moderate atrophy.  Negative for hydrocephalus

Negative for acute infarct. Mild to moderate chronic microvascular
ischemic change in the white matter. Brainstem and cerebellum
intact.

Negative for hemorrhage or mass

Paranasal sinuses reveal mild mucosal edema left ethmoid sinus.
Normal orbit. Pituitary not enlarged.
IMPRESSION: Image quality degraded by motion

Generalized atrophy with chronic microvascular ischemia. No acute
intracranial abnormality.
